# Patient Record
Sex: Male | Born: 1995
Health system: Southern US, Community
[De-identification: ages and names within clinical notes are randomized; demographics above are authoritative.]

## PROBLEM LIST (undated history)

## (undated) DIAGNOSIS — R011 Cardiac murmur, unspecified: Secondary | ICD-10-CM

## (undated) DIAGNOSIS — S83209A Unspecified tear of unspecified meniscus, current injury, unspecified knee, initial encounter: Secondary | ICD-10-CM

## (undated) HISTORY — PX: KNEE SURGERY: SHX244

---
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
## 2015-06-18 ENCOUNTER — Encounter (HOSPITAL_COMMUNITY): Payer: Self-pay | Admitting: *Deleted

## 2015-06-18 ENCOUNTER — Emergency Department (HOSPITAL_COMMUNITY)
Admission: EM | Admit: 2015-06-18 | Discharge: 2015-06-18 | Disposition: A | Discharge status: Home / Self Care | Payer: No Typology Code available for payment source | Attending: Emergency Medicine | Admitting: Emergency Medicine

## 2015-06-18 ENCOUNTER — Emergency Department (HOSPITAL_COMMUNITY)
Admission: EM | Admit: 2015-06-18 | Discharge: 2015-06-18 | Disposition: A | Discharge status: Home / Self Care | Payer: Self-pay | Source: Home / Self Care

## 2015-06-18 DIAGNOSIS — Y998 Other external cause status: Secondary | ICD-10-CM | POA: Insufficient documentation

## 2015-06-18 DIAGNOSIS — Y9389 Activity, other specified: Secondary | ICD-10-CM | POA: Diagnosis not present

## 2015-06-18 DIAGNOSIS — Y9289 Other specified places as the place of occurrence of the external cause: Secondary | ICD-10-CM | POA: Insufficient documentation

## 2015-06-18 DIAGNOSIS — S29012A Strain of muscle and tendon of back wall of thorax, initial encounter: Secondary | ICD-10-CM | POA: Diagnosis not present

## 2015-06-18 DIAGNOSIS — S299XXA Unspecified injury of thorax, initial encounter: Secondary | ICD-10-CM | POA: Diagnosis present

## 2015-06-18 MED ORDER — NAPROXEN 375 MG PO TABS: 375.0000 mg | ORAL_TABLET | Freq: Two times a day (BID) | ORAL | Status: DC

## 2015-06-18 MED ORDER — CYCLOBENZAPRINE HCL 10 MG PO TABS: 5.0000 mg | ORAL_TABLET | Freq: Two times a day (BID) | ORAL | Status: DC | PRN
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
## 2015-06-18 NOTE — ED Notes (Signed)
Declined W/C at D/C and was escorted to lobby by RN. 
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
## 2015-07-30 ENCOUNTER — Emergency Department (HOSPITAL_COMMUNITY)
Admission: EM | Admit: 2015-07-30 | Discharge: 2015-07-30 | Disposition: A | Discharge status: Home / Self Care | Payer: Self-pay | Attending: Emergency Medicine | Admitting: Emergency Medicine

## 2015-07-30 ENCOUNTER — Encounter (HOSPITAL_COMMUNITY): Payer: Self-pay | Admitting: Emergency Medicine

## 2015-07-30 DIAGNOSIS — Z8739 Personal history of other diseases of the musculoskeletal system and connective tissue: Secondary | ICD-10-CM | POA: Insufficient documentation

## 2015-07-30 DIAGNOSIS — Z792 Long term (current) use of antibiotics: Secondary | ICD-10-CM | POA: Insufficient documentation

## 2015-07-30 DIAGNOSIS — Z791 Long term (current) use of non-steroidal anti-inflammatories (NSAID): Secondary | ICD-10-CM | POA: Insufficient documentation

## 2015-07-30 DIAGNOSIS — J039 Acute tonsillitis, unspecified: Secondary | ICD-10-CM | POA: Insufficient documentation

## 2015-07-30 DIAGNOSIS — J069 Acute upper respiratory infection, unspecified: Secondary | ICD-10-CM | POA: Insufficient documentation

## 2015-07-30 LAB — RAPID STREP SCREEN (MED CTR MEBANE ONLY): Streptococcus, Group A Screen (Direct): NEGATIVE

## 2015-07-30 MED ORDER — AZITHROMYCIN 250 MG PO TABS: ORAL_TABLET | ORAL | Status: DC

## 2015-07-30 MED ORDER — AZITHROMYCIN 250 MG PO TABS
500.0000 mg | ORAL_TABLET | Freq: Once | ORAL | Status: AC
  Administered 2015-07-30: 500 mg via ORAL
  Filled 2015-07-30: qty 2

## 2015-07-30 MED ORDER — BENZONATATE 200 MG PO CAPS: 200.0000 mg | ORAL_CAPSULE | Freq: Three times a day (TID) | ORAL | Status: DC | PRN

## 2015-07-30 MED ORDER — GUAIFENESIN 100 MG/5ML PO SOLN
5.0000 mL | Freq: Once | ORAL | Status: AC
  Administered 2015-07-30: 100 mg via ORAL
  Filled 2015-07-30: qty 5

## 2015-07-30 MED ORDER — DEXAMETHASONE 4 MG PO TABS
10.0000 mg | ORAL_TABLET | Freq: Once | ORAL | Status: AC
  Administered 2015-07-30: 10 mg via ORAL
  Filled 2015-07-30: qty 3

## 2015-07-30 NOTE — ED Provider Notes (Signed)
CSN: [id number]     Arrival date & time 07/30/15  0011 History   First MD Initiated Contact with Patient 07/30/15 0017     Chief Complaint  Patient presents with  . Sore Throat     (Consider location/radiation/quality/duration/timing/severity/associated sxs/prior Treatment) Patient is a 19 y.o. male presenting with URI.  URI Presenting symptoms: congestion, cough and sore throat   Severity:  Moderate Onset quality:  Gradual Duration:  8 hours Timing:  Constant Progression:  Worsening Chronicity:  New Relieved by:  None tried Worsened by:  Eating Ineffective treatments:  None tried Associated symptoms: sinus pain    Mordecai RasmussenLorenzo B Skiver is a 19 y.o. male who presents to the ED with a sore throat and congestion. He also reports coughing today until he vomited.  Past Medical History  Diagnosis Date  . Meniscus tear     left knee   Past Surgical History  Procedure Laterality Date  . Menisectomy  06/28/2011    Procedure: MENISECTOMY;  Surgeon: Loreta Aveaniel F Murphy, MD;  [facility];  Service: Orthopedics;;  lateral menisectomy  . Knee surgery     Family History  Problem Relation Age of Onset  . Hypertension Mother   . Diabetes Maternal Grandmother   . Diabetes Paternal Grandmother    Social History  Substance Use Topics  . Smoking status: Passive Smoke Exposure - Never Smoker  . Smokeless tobacco: Never Used     Comment: smokers smoke inside  . Alcohol Use: Yes     Comment: socially    Review of Systems  HENT: Positive for congestion and sore throat.   Respiratory: Positive for cough.   all other systems negative    Allergies  Review of patient's allergies indicates no known allergies.  Home Medications   Prior to Admission medications   Medication Sig Start Date End Date Taking? Authorizing Provider  azithromycin (ZITHROMAX) 250 MG tablet Take one tablet daily 07/30/15   [facility] Orlene OchM Chalice [name], NP  benzonatate (TESSALON) 200 MG capsule Take 1  capsule (200 mg total) by mouth 3 (three) times daily as needed for cough. 07/30/15   Natasja Niday Orlene OchM Shirah Roseman, NP  cyclobenzaprine (FLEXERIL) 10 MG tablet Take 0.5-1 tablets (5-10 mg total) by mouth 2 (two) times daily as needed. 06/18/15   Tiffany Neva SeatGreene, PA-C  naproxen (NAPROSYN) 375 MG tablet Take 1 tablet (375 mg total) by mouth 2 (two) times daily. 06/18/15   Tiffany Neva SeatGreene, PA-C   BP 151/78 mmHg  Pulse 61  Temp(Src) 98.2 F (36.8 C) (Oral)  Resp 16  SpO2 99% Physical Exam  Constitutional: He is oriented to person, place, and time. He appears well-developed and well-nourished. No distress.  HENT:  Head: Normocephalic and atraumatic.  Right Ear: Tympanic membrane normal.  Left Ear: Tympanic membrane normal.  Nose: Mucosal edema present.  Mouth/Throat: Uvula is midline and mucous membranes are normal. Posterior oropharyngeal erythema present. No tonsillar abscesses.  Tonsils enlarged with erythema  Eyes: Conjunctivae and EOM are normal. Pupils are equal, round, and reactive to light.  Neck: Normal range of motion. Neck supple.  Cardiovascular: Normal rate and regular rhythm.   Pulmonary/Chest: Effort normal. No respiratory distress. He has no wheezes. He has no rales.  Abdominal: Soft. There is no tenderness.  Musculoskeletal: Normal range of motion.  Lymphadenopathy:    He has cervical adenopathy (right).  Neurological: He is alert and oriented to person, place, and time. No cranial nerve deficit.  Skin: Skin is warm and dry.  Psychiatric:  He has a normal mood and affect. His behavior is normal.  Nursing note and vitals reviewed.   ED Course  Procedures (including critical care time) Labs Review Results for orders placed or performed during the hospital encounter of 07/30/15 (from the past 24 hour(s))  Rapid strep screen     Status: None   Collection Time: 07/30/15 12:37 AM  Result Value Ref Range   Streptococcus, Group A Screen (Direct) NEGATIVE NEGATIVE    Imaging Review No  results found. I have personally reviewed and evaluated the lab results as part of my medical decision-making.   MDM  19 y.o. male with sore throat and cough that started earlier today. Stable for d/c without tonsillar abscess and no respiratory distress. O2 SAT 99% on R/A. Discussed with the patient clinical and lab findings and plan of care. All questioned fully answered. He will return if any problems arise.   Final diagnoses:  Tonsillitis  URI (upper respiratory infection)       Janne Napoleon, NP 07/30/15 0125  Layla Maw Ward, DO 07/30/15 [phone]

## 2015-07-30 NOTE — ED Notes (Signed)
During scanning of patient meds patient states he took a dose of his mother's Amoxicillin earlier.  Made NP aware.

## 2015-07-30 NOTE — ED Notes (Signed)
Pt sts he woke up this morning to a runny nose, cough, and sore throat.  PT states he has coughed so hard he has vomited today.  NAD in room at this time

## 2015-07-30 NOTE — ED Notes (Signed)
Pt able to ambulate independently 

## 2015-08-01 LAB — CULTURE, GROUP A STREP: STREP A CULTURE: NEGATIVE

## 2015-12-12 ENCOUNTER — Ambulatory Visit: Payer: Worker's Compensation

## 2015-12-12 NOTE — Patient Instructions (Signed)
     IF you received an x-ray today, you will receive an invoice from [facility]. Please contact [facility] at [phone] with questions or concerns regarding your invoice.   IF you received labwork today, you will receive an invoice from [facility]. Please contact [facility] at [phone] with questions or concerns regarding your invoice.   Our billing staff will not be able to assist you with questions regarding bills from these companies.  You will be contacted with the lab results as soon as they are available. The fastest way to get your results is to activate your My Chart account. Instructions are located on the last page of this paperwork. If you have not heard from us regarding the results in 2 weeks, please contact this office.      

## 2015-12-27 ENCOUNTER — Emergency Department (HOSPITAL_COMMUNITY)
Admission: EM | Admit: 2015-12-27 | Discharge: 2015-12-27 | Disposition: A | Discharge status: Home / Self Care | Payer: No Typology Code available for payment source | Attending: Emergency Medicine | Admitting: Emergency Medicine

## 2015-12-27 ENCOUNTER — Encounter (HOSPITAL_COMMUNITY): Payer: Self-pay | Admitting: *Deleted

## 2015-12-27 DIAGNOSIS — R197 Diarrhea, unspecified: Secondary | ICD-10-CM | POA: Insufficient documentation

## 2015-12-27 DIAGNOSIS — R112 Nausea with vomiting, unspecified: Secondary | ICD-10-CM | POA: Insufficient documentation

## 2015-12-27 DIAGNOSIS — R109 Unspecified abdominal pain: Secondary | ICD-10-CM | POA: Insufficient documentation

## 2015-12-27 DIAGNOSIS — Z87828 Personal history of other (healed) physical injury and trauma: Secondary | ICD-10-CM | POA: Insufficient documentation

## 2015-12-27 LAB — COMPREHENSIVE METABOLIC PANEL
ALK PHOS: 57 U/L (ref 38–126)
ALT: 15 U/L — ABNORMAL LOW (ref 17–63)
ANION GAP: 10 (ref 5–15)
AST: 24 U/L (ref 15–41)
Albumin: 4 g/dL (ref 3.5–5.0)
BUN: 7 mg/dL (ref 6–20)
CALCIUM: 9.4 mg/dL (ref 8.9–10.3)
CO2: 26 mmol/L (ref 22–32)
Chloride: 106 mmol/L (ref 101–111)
Creatinine, Ser: 1.09 mg/dL (ref 0.61–1.24)
GFR calc non Af Amer: >60 mL/min (ref >60)
Glucose, Bld: 101 mg/dL — ABNORMAL HIGH (ref 65–99)
POTASSIUM: 3.6 mmol/L (ref 3.5–5.1)
SODIUM: 142 mmol/L (ref 135–145)
Total Bilirubin: 1 mg/dL (ref 0.3–1.2)
Total Protein: 6.9 g/dL (ref 6.5–8.1)

## 2015-12-27 LAB — URINALYSIS, ROUTINE W REFLEX MICROSCOPIC
Bilirubin Urine: NEGATIVE
GLUCOSE, UA: NEGATIVE mg/dL
HGB URINE DIPSTICK: NEGATIVE
Ketones, ur: NEGATIVE mg/dL
LEUKOCYTES UA: NEGATIVE
Nitrite: NEGATIVE
PH: 6.5 (ref 5.0–8.0)
PROTEIN: NEGATIVE mg/dL
SPECIFIC GRAVITY, URINE: 1.025 (ref 1.005–1.030)

## 2015-12-27 LAB — CBC
HCT: 43.5 % (ref 39.0–52.0)
HEMOGLOBIN: 14.5 g/dL (ref 13.0–17.0)
MCH: 29.9 pg (ref 26.0–34.0)
MCHC: 33.3 g/dL (ref 30.0–36.0)
MCV: 89.7 fL (ref 78.0–100.0)
Platelets: 212 10*3/uL (ref 150–400)
RBC: 4.85 MIL/uL (ref 4.22–5.81)
RDW: 12 % (ref 11.5–15.5)
WBC: 3.7 10*3/uL — ABNORMAL LOW (ref 4.0–10.5)

## 2015-12-27 LAB — LIPASE, BLOOD: LIPASE: 22 U/L (ref 11–51)

## 2015-12-27 MED ORDER — ONDANSETRON 4 MG PO TBDP
4.0000 mg | ORAL_TABLET | Freq: Once | ORAL | Status: AC | PRN
  Administered 2015-12-27: 4 mg via ORAL

## 2015-12-27 MED ORDER — SODIUM CHLORIDE 0.9 % IV SOLN
80.0000 mg | Freq: Once | INTRAVENOUS | Status: AC
  Administered 2015-12-27: 80 mg via INTRAVENOUS
  Filled 2015-12-27: qty 80

## 2015-12-27 MED ORDER — ONDANSETRON 4 MG PO TBDP
ORAL_TABLET | ORAL | Status: AC
  Filled 2015-12-27: qty 1

## 2015-12-27 MED ORDER — ONDANSETRON HCL 4 MG/2ML IJ SOLN
4.0000 mg | Freq: Once | INTRAMUSCULAR | Status: AC
  Administered 2015-12-27: 4 mg via INTRAVENOUS
  Filled 2015-12-27: qty 2

## 2015-12-27 MED ORDER — ONDANSETRON 8 MG PO TBDP: 8.0000 mg | ORAL_TABLET | Freq: Three times a day (TID) | ORAL | Status: DC | PRN

## 2015-12-27 MED ORDER — SODIUM CHLORIDE 0.9 % IV BOLUS (SEPSIS)
1000.0000 mL | Freq: Once | INTRAVENOUS | Status: AC
  Administered 2015-12-27: 1000 mL via INTRAVENOUS

## 2015-12-27 MED ORDER — METOCLOPRAMIDE HCL 5 MG/ML IJ SOLN
10.0000 mg | Freq: Once | INTRAMUSCULAR | Status: AC
  Administered 2015-12-27: 10 mg via INTRAVENOUS
  Filled 2015-12-27: qty 2

## 2015-12-27 NOTE — ED Notes (Signed)
Pt in from home via [facility] c/o n/v/d onset today @ 6am, pt reports last meal yesterday at noon, pt reports x 4 vomiting episodes & x 3 diarrhea episodes since onset, pt A&O x4

## 2015-12-27 NOTE — ED Provider Notes (Signed)
CSN: [id number]     Arrival date & time 12/27/15  0840 History   First MD Initiated Contact with Patient 12/27/15 [phone]     Chief Complaint  Patient presents with  . Emesis     (Consider location/radiation/quality/duration/timing/severity/associated sxs/prior Treatment) Patient is a 20 y.o. male presenting with vomiting. The history is provided by the patient.  Emesis Severity:  Moderate Timing:  Constant Quality:  Bilious material Able to tolerate:  Liquids Progression:  Unchanged Chronicity:  New Recent urination:  Normal Context: not post-tussive and not self-induced   Relieved by:  Antiemetics Worsened by:  Nothing tried Ineffective treatments:  None tried Associated symptoms: abdominal pain and diarrhea   Associated symptoms: no arthralgias, no chills, no cough, no fever and no headaches   Associated symptoms comment:  Abdominal pain began after vomiting, no pain preceded Patient drank alcohol last nigh and recently quit daily marijuana Abdominal pain:    Location:  Epigastric   Quality:  Aching   Severity:  Mild   Onset quality:  Gradual   Timing:  Constant   Chronicity:  New Diarrhea:    Quality:  Semi-solid   Number of occurrences:  3   Severity:  Moderate Risk factors: alcohol use   Risk factors: no diabetes, not pregnant now, no prior abdominal surgery, no sick contacts, no suspect food intake and no travel to endemic areas     Past Medical History  Diagnosis Date  . Meniscus tear     left knee   Past Surgical History  Procedure Laterality Date  . Menisectomy  06/28/2011    Procedure: MENISECTOMY;  Surgeon: Loreta Aveaniel F Murphy, MD;  Location: [facility];  Service: Orthopedics;;  lateral menisectomy  . Knee surgery     Family History  Problem Relation Age of Onset  . Hypertension Mother   . Diabetes Maternal Grandmother   . Diabetes Paternal Grandmother    Social History  Substance Use Topics  . Smoking status: Passive Smoke Exposure -  Never Smoker  . Smokeless tobacco: Never Used     Comment: smokers smoke inside  . Alcohol Use: Yes     Comment: socially    Review of Systems  Constitutional: Negative for chills.  Gastrointestinal: Positive for vomiting, abdominal pain and diarrhea.  Musculoskeletal: Negative for arthralgias.  Neurological: Negative for headaches.      Allergies  Review of patient's allergies indicates no known allergies.  Home Medications   Prior to Admission medications   Not on File   There were no vitals taken for this visit. Physical Exam  Constitutional: He is oriented to person, place, and time. He appears well-developed and well-nourished.  HENT:  Head: Normocephalic and atraumatic.  Right Ear: External ear normal.  Left Ear: External ear normal.  Nose: Nose normal.  Mouth/Throat: Oropharynx is clear and moist.  Eyes: Conjunctivae and EOM are normal. Pupils are equal, round, and reactive to light.  Neck: Normal range of motion. Neck supple.  Cardiovascular: Normal rate, regular rhythm, normal heart sounds and intact distal pulses.   Pulmonary/Chest: Effort normal and breath sounds normal. No respiratory distress. He has no wheezes. He exhibits no tenderness.  Abdominal: Soft. Bowel sounds are normal. He exhibits no distension and no mass. There is no tenderness. There is no guarding.  Musculoskeletal: Normal range of motion.  Neurological: He is alert and oriented to person, place, and time. He has normal reflexes. He exhibits normal muscle tone. Coordination normal.  Skin: Skin is warm  and dry.  Psychiatric: He has a normal mood and affect. His behavior is normal. Judgment and thought content normal.  Nursing note and vitals reviewed.   ED Course  Procedures (including critical care time) Labs Review Labs Reviewed  COMPREHENSIVE METABOLIC PANEL - Abnormal; Notable for the following:    Glucose, Bld 101 (*)    ALT 15 (*)    All other components within normal limits  CBC  - Abnormal; Notable for the following:    WBC 3.7 (*)    All other components within normal limits  LIPASE, BLOOD  URINALYSIS, ROUTINE W REFLEX MICROSCOPIC (NOT AT [facility])    Imaging Review No results found. I have personally reviewed and evaluated these images and lab results as part of my medical decision-making.   EKG Interpretation None      MDM   Final diagnoses:  Nausea vomiting and diarrhea      Margarita Grizzle, MD 12/27/15 1151

## 2015-12-27 NOTE — Discharge Instructions (Signed)

## 2016-01-12 ENCOUNTER — Emergency Department (HOSPITAL_COMMUNITY)
Admission: EM | Admit: 2016-01-12 | Discharge: 2016-01-12 | Disposition: A | Discharge status: Home / Self Care | Payer: No Typology Code available for payment source | Attending: Emergency Medicine | Admitting: Emergency Medicine

## 2016-01-12 ENCOUNTER — Encounter (HOSPITAL_COMMUNITY): Payer: Self-pay

## 2016-01-12 DIAGNOSIS — Z7722 Contact with and (suspected) exposure to environmental tobacco smoke (acute) (chronic): Secondary | ICD-10-CM | POA: Insufficient documentation

## 2016-01-12 DIAGNOSIS — H00011 Hordeolum externum right upper eyelid: Secondary | ICD-10-CM | POA: Insufficient documentation

## 2016-01-12 DIAGNOSIS — H00013 Hordeolum externum right eye, unspecified eyelid: Secondary | ICD-10-CM

## 2016-01-12 NOTE — ED Provider Notes (Signed)
CSN: [id number]     Arrival date & time 01/12/16  1509 History   First MD Initiated Contact with Patient 01/12/16 1559     Chief Complaint  Patient presents with  . eyelid swelling      (Consider location/radiation/quality/duration/timing/severity/associated sxs/prior Treatment) HPI   20 year old male presents with c/o R eyelid swelling.  Since yesterday pt has R swollen eyelid.  2 nights ago he report his R eye was itchy.  Has used Visine eye drop without relief, but some relief with warm compress over the eyelid.  Report dull pain around the eye and pain with blinking.  Mild light sensitivity, and vision mildly blurry yesterday but none recent.  Denies fb sensation and denies drainage.  Eyes were not crusted shut.  Does not wear contact lens or glasses.  Denies any other environmental changes.  Did start a new job last week and sts he did a lot of lifting and felt that his dirty hands may have irritated his eye.  No fever, ear pain, sore throats, or URI sxs.  No one else with similar sickness.    Past Medical History  Diagnosis Date  . Meniscus tear     left knee   Past Surgical History  Procedure Laterality Date  . Menisectomy  06/28/2011    Procedure: MENISECTOMY;  Surgeon: Loreta Aveaniel F Murphy, MD;  Location: [facility];  Service: Orthopedics;;  lateral menisectomy  . Knee surgery     Family History  Problem Relation Age of Onset  . Hypertension Mother   . Diabetes Maternal Grandmother   . Diabetes Paternal Grandmother    Social History  Substance Use Topics  . Smoking status: Passive Smoke Exposure - Never Smoker  . Smokeless tobacco: Never Used     Comment: smokers smoke inside  . Alcohol Use: Yes     Comment: socially    Review of Systems  Constitutional: Negative for fever.  Eyes: Positive for pain, redness and itching. Negative for discharge.  Skin: Negative for rash and wound.  Neurological: Negative for headaches.      Allergies  Review of  patient's allergies indicates no known allergies.  Home Medications   Prior to Admission medications   Medication Sig Start Date End Date Taking? Authorizing Provider  ondansetron (ZOFRAN ODT) 8 MG disintegrating tablet Take 1 tablet (8 mg total) by mouth every 8 (eight) hours as needed for nausea or vomiting. 12/27/15   Margarita Grizzleanielle Ray, MD   BP 116/77 mmHg  Pulse 82  Temp(Src) 98.4 F (36.9 C) (Oral)  Resp 18  SpO2 96% Physical Exam  Constitutional: He appears well-developed and well-nourished. No distress.  HENT:  Head: Atraumatic.  Eyes: Conjunctivae and EOM are normal. Pupils are equal, round, and reactive to light. Right eye exhibits hordeolum. Right eye exhibits no chemosis, no discharge and no exudate. No foreign body present in the right eye. Left eye exhibits no discharge. No scleral icterus.  R eye: upper eyelid is edematous and mildly tender to palpation.  No fb noted, no discharge.  EOMI, PERRL.    Neck: Neck supple.  Neurological: He is alert.  Skin: No rash noted.  Psychiatric: He has a normal mood and affect.  Nursing note and vitals reviewed.   ED Course  Procedures (including critical care time)   MDM   Final diagnoses:  Hordeolum external, right    BP 116/77 mmHg  Pulse 82  Temp(Src) 98.4 F (36.9 C) (Oral)  Resp 18  SpO2  96%    4:35 PM Pt here with R eyelid swelling that is painful.  This is consistent with a hordeolum and less likely a chalazion.  No eye involvement.  Doubt conjunctivitis.  Recommend warm compress.  Eye specialist referral given as needed.  Return precaution discussed.    Fayrene Helper, PA-C 01/12/16 1637  Derwood Kaplan, MD 01/13/16 0730

## 2016-01-12 NOTE — ED Notes (Signed)
Patient here with right eyelid swelling x 2 days. Reports that the lid is throbbing and some blurred vision with same

## 2016-01-12 NOTE — ED Notes (Signed)
PA at bedside.

## 2016-01-12 NOTE — Discharge Instructions (Signed)
Continue to apply warm compress regularly as treatment of your stye.  Stye A stye is a bump on your eyelid caused by a bacterial infection. A stye can form inside the eyelid (internal stye) or outside the eyelid (external stye). An internal stye may be caused by an infected oil-producing gland inside your eyelid. An external stye may be caused by an infection at the base of your eyelash (hair follicle). Styes are very common. Anyone can get them at any age. They usually occur in just one eye, but you may have more than one in either eye.  CAUSES  The infection is almost always caused by bacteria called Staphylococcus aureus. This is a common type of bacteria that lives on your skin. RISK FACTORS You may be at higher risk for a stye if you have had one before. You may also be at higher risk if you have:  Diabetes.  Long-term illness.  Long-term eye redness.  A skin condition called seborrhea.  High fat levels in your blood (lipids). SIGNS AND SYMPTOMS  Eyelid pain is the most common symptom of a stye. Internal styes are more painful than external styes. Other signs and symptoms may include:  Painful swelling of your eyelid.  A scratchy feeling in your eye.  Tearing and redness of your eye.  Pus draining from the stye. DIAGNOSIS  Your health care provider may be able to diagnose a stye just by examining your eye. The health care provider may also check to make sure:  You do not have a fever or other signs of a more serious infection.  The infection has not spread to other parts of your eye or areas around your eye. TREATMENT  Most styes will clear up in a few days without treatment. In some cases, you may need to use antibiotic drops or ointment to prevent infection. Your health care provider may have to drain the stye surgically if your stye is:  Large.  Causing a lot of pain.  Interfering with your vision. This can be done using a thin blade or a needle.  HOME CARE  INSTRUCTIONS   Take medicines only as directed by your health care provider.  Apply a clean, warm compress to your eye for 10 minutes, 4 times a day.  Do not wear contact lenses or eye makeup until your stye has healed.  Do not try to pop or drain the stye. SEEK MEDICAL CARE IF:  You have chills or a fever.  Your stye does not go away after several days.  Your stye affects your vision.  Your eyeball becomes swollen, red, or painful. MAKE SURE YOU:  Understand these instructions.  Will watch your condition.  Will get help right away if you are not doing well or get worse.   This information is not intended to replace advice given to you by your health care provider. Make sure you discuss any questions you have with your health care provider.   Document Released: 05/09/2005 Document Revised: 08/20/2014 Document Reviewed: 11/13/2013 Elsevier Interactive Patient Education [employer]2016 Elsevier Inc.

## 2016-01-12 NOTE — ED Notes (Signed)
Patient able to ambulate independently  

## 2016-04-04 ENCOUNTER — Encounter (HOSPITAL_COMMUNITY): Payer: Self-pay | Admitting: Emergency Medicine

## 2016-04-04 ENCOUNTER — Emergency Department (HOSPITAL_COMMUNITY): Payer: Self-pay

## 2016-04-04 ENCOUNTER — Emergency Department (HOSPITAL_COMMUNITY)
Admission: EM | Admit: 2016-04-04 | Discharge: 2016-04-04 | Disposition: A | Discharge status: Home / Self Care | Payer: Self-pay | Attending: Emergency Medicine | Admitting: Emergency Medicine

## 2016-04-04 DIAGNOSIS — S61012A Laceration without foreign body of left thumb without damage to nail, initial encounter: Secondary | ICD-10-CM | POA: Insufficient documentation

## 2016-04-04 DIAGNOSIS — Y999 Unspecified external cause status: Secondary | ICD-10-CM | POA: Insufficient documentation

## 2016-04-04 DIAGNOSIS — Z79899 Other long term (current) drug therapy: Secondary | ICD-10-CM | POA: Insufficient documentation

## 2016-04-04 DIAGNOSIS — Z23 Encounter for immunization: Secondary | ICD-10-CM | POA: Insufficient documentation

## 2016-04-04 DIAGNOSIS — Y929 Unspecified place or not applicable: Secondary | ICD-10-CM | POA: Insufficient documentation

## 2016-04-04 DIAGNOSIS — W25XXXA Contact with sharp glass, initial encounter: Secondary | ICD-10-CM | POA: Insufficient documentation

## 2016-04-04 DIAGNOSIS — Z7722 Contact with and (suspected) exposure to environmental tobacco smoke (acute) (chronic): Secondary | ICD-10-CM | POA: Insufficient documentation

## 2016-04-04 DIAGNOSIS — Y9389 Activity, other specified: Secondary | ICD-10-CM | POA: Insufficient documentation

## 2016-04-04 MED ORDER — TETANUS-DIPHTH-ACELL PERTUSSIS 5-2.5-18.5 LF-MCG/0.5 IM SUSP
0.5000 mL | Freq: Once | INTRAMUSCULAR | Status: AC
  Administered 2016-04-04: 0.5 mL via INTRAMUSCULAR
  Filled 2016-04-04: qty 0.5

## 2016-04-04 MED ORDER — LIDOCAINE-EPINEPHRINE 2 %-1:100000 IJ SOLN
10.0000 mL | Freq: Once | INTRAMUSCULAR | Status: AC
  Administered 2016-04-04: 10 mL
  Filled 2016-04-04: qty 1

## 2016-04-04 NOTE — Discharge Instructions (Signed)
Keep wound clean with mild soap and water. Keep area covered with a topical antibiotic ointment and bandage, keep bandage dry, and do not submerge in water for 24 hours. Wear splint on thumb to protect the sutures while the wound is healing. Ice and elevate for additional pain relief and swelling. Alternate between Ibuprofen and Tylenol for additional pain relief. Follow up with your primary care doctor or the [facility] in approximately 7-10 days for wound recheck and suture removal. Monitor area for signs of infection to include, but not limited to: increasing pain, spreading redness, drainage/pus, worsening swelling, or fevers. Return to emergency department for emergent changing or worsening symptoms.

## 2016-04-04 NOTE — ED Notes (Signed)
Patient was alert, oriented and stable upon discharge. RN went over AVS and patient had no further questions.  

## 2016-04-04 NOTE — ED Provider Notes (Signed)
[facility]-EMERGENCY DEPT Provider Note   CSN: [id number] Arrival date & time: 04/04/16  1214  By signing my name below, I, Modena Jansky, attest that this documentation has been prepared under the direction and in the presence of non-physician practitioner, [address], PA-C. Electronically Signed: Modena Jansky, Scribe. 04/04/2016. 2:35 PM.   History   Chief Complaint Chief Complaint  Patient presents with  . Laceration   The history is provided by the patient and medical records. No language interpreter was used.  Laceration   The incident occurred 3 to 5 hours ago. The laceration is located on the left hand (left thumb). The laceration is 1 cm in size. The laceration mechanism was a a metal edge. The pain is at a severity of 9/10. The pain is moderate. The pain has been constant since onset. He reports no foreign bodies present. His tetanus status is unknown.   HPI Comments: Steve Wilson is a 20 y.o. male who presents to the Emergency Department complaining of a left thumb wound that occurred about 3 hours prior to exam (at around 11:30am). He reports that he cut his left thumb on the metal car window frame while changing the regulator, and now has a bleeding wound in the affected area. He states that he has some mild pain at the site of the laceration, which he describes as 9/10 constant stinging non-radiating left thumb pain, exacerbated by movement, and he had no treatment tried PTA. His tetanus status is unknown. He denies any other injuries or hx of bleeding disorders. Bleeding somewhat controlled with pressure. He denies any numbness/tingling, or weakness.    Past Medical History:  Diagnosis Date  . Meniscus tear    left knee    There are no active problems to display for this patient.   Past Surgical History:  Procedure Laterality Date  . KNEE SURGERY    . MENISECTOMY  06/28/2011   Procedure: MENISECTOMY;  Surgeon: Loreta Ave, MD;  Location: [facility];  Service: Orthopedics;;  lateral menisectomy       Home Medications    Prior to Admission medications   Medication Sig Start Date End Date Taking? Authorizing Provider  ondansetron (ZOFRAN ODT) 8 MG disintegrating tablet Take 1 tablet (8 mg total) by mouth every 8 (eight) hours as needed for nausea or vomiting. 12/27/15   Margarita Grizzle, MD    Family History Family History  Problem Relation Age of Onset  . Hypertension Mother   . Diabetes Maternal Grandmother   . Diabetes Paternal Grandmother     Social History Social History  Substance Use Topics  . Smoking status: Passive Smoke Exposure - Never Smoker  . Smokeless tobacco: Never Used     Comment: smokers smoke inside  . Alcohol use Yes     Comment: socially     Allergies   Review of patient's allergies indicates no known allergies.   Review of Systems Review of Systems  Musculoskeletal: Positive for arthralgias (L thumb).  Skin: Positive for wound (Left thumb).  Allergic/Immunologic: Negative for immunocompromised state.  Neurological: Negative for weakness and numbness.  Hematological: Does not bruise/bleed easily.     Physical Exam Updated Vital Signs BP 127/78   Pulse (!) 53   Temp 98.7 F (37.1 C)   Resp 15   SpO2 99%   Physical Exam  Constitutional: He is oriented to person, place, and time. Vital signs are normal. He appears well-developed and well-nourished.  Non-toxic appearance. No distress.  Afebrile, nontoxic, NAD  HENT:  Head: Normocephalic and atraumatic.  Mouth/Throat: Mucous membranes are normal.  Eyes: Conjunctivae and EOM are normal. Right eye exhibits no discharge. Left eye exhibits no discharge.  Neck: Normal range of motion. Neck supple.  Cardiovascular: Normal rate and intact distal pulses.   Pulmonary/Chest: Effort normal. No respiratory distress.  Abdominal: Normal appearance. He exhibits no distension.  Musculoskeletal:       Left hand: He exhibits decreased  range of motion (Due to pain), tenderness, bony tenderness and laceration. He exhibits normal two-point discrimination, normal capillary refill, no deformity and no swelling. Normal sensation noted. Normal strength noted.  Left thumb with minimally limited ROM due to pain, although still able to flex and extend at [facility] and PIP joints, with small linear laceration ~1 cm in length to the lateral aspect of left thumb with some minimal oozing bleeding, which is controlled with pressure. Mild TTP over the laceration, no crepitus or deformity, no swelling, with sensation and strength grossly intact, cap refill brisk and present, distal pulses intact. SEE PICTURE BELOW  Neurological: He is alert and oriented to person, place, and time. He has normal strength. No sensory deficit.  Skin: Skin is warm and dry. Laceration noted. No rash noted.  Left thumb lac as noted above and pictured below.   Psychiatric: He has a normal mood and affect.  Nursing note and vitals reviewed.      ED Treatments / Results  DIAGNOSTIC STUDIES: Oxygen Saturation is 99% on RA, normal by my interpretation.    COORDINATION OF CARE: 2:28 PM- Pt advised of plan for treatment and pt agrees.  Labs (all labs ordered are listed, but only abnormal results are displayed) Labs Reviewed - No data to display  EKG  EKG Interpretation None       Radiology Dg Finger Thumb Left  Result Date: 04/04/2016 CLINICAL DATA:  Left thumb laceration today. Evaluate for foreign body or fracture. Initial encounter. EXAM: LEFT THUMB 2+V COMPARISON:  05/05/2008. FINDINGS: The mineralization and alignment are normal. There is no evidence of acute fracture or dislocation. The joint spaces are maintained. No foreign bodies are identified. There is mild soft tissue irregularity around the interphalangeal joint at the site of the patient's laceration. Overlying bandage noted. IMPRESSION: No acute osseous findings or evidence of foreign body.  Electronically Signed   By: Carey BullocksWilliam  Veazey M.D.   On: 04/04/2016 15:10    Procedures .Marland Kitchen.Laceration Repair Date/Time: 04/04/2016 3:30 PM Performed by: Allen DerryAMPRUBI-SOMS, Marino Rogerson Authorized by: Allen DerryAMPRUBI-SOMS, Darian Cansler   Consent:    Consent obtained:  Verbal   Consent given by:  Patient   Risks discussed:  Pain and infection   Alternatives discussed:  No treatment and delayed treatment Anesthesia (see MAR for exact dosages):    Anesthesia method:  Local infiltration   Local anesthetic:  Lidocaine 2% WITH epi Laceration details:    Location:  Finger   Finger location:  L thumb   Length (cm):  1.5   Depth (mm):  2 Repair type:    Repair type:  Simple Pre-procedure details:    Preparation:  Patient was prepped and draped in usual sterile fashion and imaging obtained to evaluate for foreign bodies Exploration:    Hemostasis achieved with:  Tourniquet   Wound exploration: wound explored through full range of motion and entire depth of wound probed and visualized     Wound extent: no foreign bodies/material noted, no nerve damage noted, no tendon damage noted, no underlying fracture noted  and no vascular damage noted     Contaminated: no   Treatment:    Area cleansed with:  Saline   Amount of cleaning:  Extensive   Irrigation solution:  Sterile saline   Irrigation method:  Syringe   Visualized foreign bodies/material removed: no   Skin repair:    Repair method:  Sutures   Suture size:  5-0   Suture material:  Prolene   Suture technique:  Simple interrupted   Number of sutures:  3 Approximation:    Approximation:  Close   Vermilion border: well-aligned   Post-procedure details:    Dressing:  Splint for protection, antibiotic ointment and non-adherent dressing   Patient tolerance of procedure:  Tolerated well, no immediate complications   (including critical care time)  Medications Ordered in ED Medications  lidocaine-EPINEPHrine (XYLOCAINE W/EPI) 2 %-1:100000 (with pres)  injection 10 mL (not administered)  Tdap (BOOSTRIX) injection 0.5 mL (0.5 mLs Intramuscular Given 04/04/16 1454)     Initial Impression / Assessment and Plan / ED Course  I have reviewed the triage vital signs and the nursing notes.  Pertinent labs & imaging results that were available during my care of the patient were reviewed by me and considered in my medical decision making (see chart for details).  Clinical Course    20 y.o. male here with linear lac to L thumb, oozing blood but well controlled with pressure, no obvious FBs noted but difficult to assess due to oozing blood. NVI with soft compartments and ROM preserved in digit. Will obtain xray to ensure no underlying bone injury or FBs. Will update tetanus. Will proceed with suture repair after xray.   3:49 PM  Xray neg. Wound repaired with 3 sutures, good hemostasis. Will apply splint for protection while it's healing. Wound care discussed. Doubt need for ppx abx. F/up with PCP or [facility] in 7-10 days for suture removal. RICE and tylenol/motrin for pain. I explained the diagnosis and have given explicit precautions to return to the ER including for any other new or worsening symptoms. The patient understands and accepts the medical plan as it's been dictated and I have answered their questions. Discharge instructions concerning home care and prescriptions have been given. The patient is STABLE and is discharged to home in good condition.    Final Clinical Impressions(s) / ED Diagnoses   Final diagnoses:  Thumb laceration, left, initial encounter    New Prescriptions New Prescriptions   No medications on file   I personally performed the services described in this documentation, which was scribed in my presence. The recorded information has been reviewed and is accurate.     Sheliah Fiorillo Camprubi-Soms, PA-C 04/04/16 1551    Arby BarretteMarcy Pfeiffer, MD 04/07/16 1433

## 2016-04-04 NOTE — ED Triage Notes (Signed)
Patient here from home with complaints of laceration of left thumb. Bleeding. Reports cutting thumb on glass in car.

## 2017-07-17 ENCOUNTER — Encounter (HOSPITAL_COMMUNITY): Payer: Self-pay

## 2017-07-17 ENCOUNTER — Emergency Department (HOSPITAL_COMMUNITY)
Admission: EM | Admit: 2017-07-17 | Discharge: 2017-07-17 | Disposition: A | Discharge status: Home / Self Care | Payer: No Typology Code available for payment source | Attending: Emergency Medicine | Admitting: Emergency Medicine

## 2017-07-17 ENCOUNTER — Emergency Department (HOSPITAL_COMMUNITY): Payer: No Typology Code available for payment source

## 2017-07-17 ENCOUNTER — Other Ambulatory Visit: Payer: Self-pay

## 2017-07-17 DIAGNOSIS — S3992XA Unspecified injury of lower back, initial encounter: Secondary | ICD-10-CM | POA: Diagnosis present

## 2017-07-17 DIAGNOSIS — Z7722 Contact with and (suspected) exposure to environmental tobacco smoke (acute) (chronic): Secondary | ICD-10-CM | POA: Diagnosis not present

## 2017-07-17 DIAGNOSIS — Y999 Unspecified external cause status: Secondary | ICD-10-CM | POA: Diagnosis not present

## 2017-07-17 DIAGNOSIS — Y9389 Activity, other specified: Secondary | ICD-10-CM | POA: Insufficient documentation

## 2017-07-17 DIAGNOSIS — S39012A Strain of muscle, fascia and tendon of lower back, initial encounter: Secondary | ICD-10-CM | POA: Diagnosis not present

## 2017-07-17 DIAGNOSIS — Y92410 Unspecified street and highway as the place of occurrence of the external cause: Secondary | ICD-10-CM | POA: Insufficient documentation

## 2017-07-17 MED ORDER — IBUPROFEN 600 MG PO TABS: 600.0000 mg | ORAL_TABLET | Freq: Four times a day (QID) | ORAL | 0 refills | Status: DC | PRN
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
## 2017-08-01 ENCOUNTER — Other Ambulatory Visit: Payer: Self-pay

## 2017-08-01 ENCOUNTER — Encounter (HOSPITAL_COMMUNITY): Payer: Self-pay

## 2017-08-01 ENCOUNTER — Emergency Department (HOSPITAL_COMMUNITY)
Admission: EM | Admit: 2017-08-01 | Discharge: 2017-08-01 | Disposition: A | Discharge status: Home / Self Care | Payer: No Typology Code available for payment source | Attending: Emergency Medicine | Admitting: Emergency Medicine

## 2017-08-01 DIAGNOSIS — Z7722 Contact with and (suspected) exposure to environmental tobacco smoke (acute) (chronic): Secondary | ICD-10-CM | POA: Insufficient documentation

## 2017-08-01 DIAGNOSIS — M25562 Pain in left knee: Secondary | ICD-10-CM | POA: Diagnosis not present

## 2017-08-01 DIAGNOSIS — M25561 Pain in right knee: Secondary | ICD-10-CM

## 2017-08-01 DIAGNOSIS — R51 Headache: Secondary | ICD-10-CM | POA: Insufficient documentation

## 2017-08-01 DIAGNOSIS — R519 Headache, unspecified: Secondary | ICD-10-CM

## 2017-08-01 MED ORDER — NAPROXEN 375 MG PO TABS: 375.0000 mg | ORAL_TABLET | Freq: Two times a day (BID) | ORAL | 0 refills | Status: DC

## 2017-08-01 MED ORDER — ACETAMINOPHEN 325 MG PO TABS
650.0000 mg | ORAL_TABLET | Freq: Once | ORAL | Status: AC
  Administered 2017-08-01: 650 mg via ORAL
  Filled 2017-08-01: qty 2
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
## 2017-08-11 ENCOUNTER — Emergency Department (HOSPITAL_COMMUNITY)
Admission: EM | Admit: 2017-08-11 | Discharge: 2017-08-11 | Disposition: A | Discharge status: Home / Self Care | Payer: Self-pay | Attending: Emergency Medicine | Admitting: Emergency Medicine

## 2017-08-11 ENCOUNTER — Encounter (HOSPITAL_COMMUNITY): Payer: Self-pay | Admitting: Emergency Medicine

## 2017-08-11 DIAGNOSIS — H60392 Other infective otitis externa, left ear: Secondary | ICD-10-CM

## 2017-08-11 DIAGNOSIS — L089 Local infection of the skin and subcutaneous tissue, unspecified: Secondary | ICD-10-CM | POA: Insufficient documentation

## 2017-08-11 MED ORDER — SULFAMETHOXAZOLE-TRIMETHOPRIM 800-160 MG PO TABS: 1.0000 | ORAL_TABLET | Freq: Two times a day (BID) | ORAL | 0 refills | Status: AC

## 2017-08-11 MED ORDER — CEPHALEXIN 500 MG PO CAPS: 500.0000 mg | ORAL_CAPSULE | Freq: Four times a day (QID) | ORAL | 0 refills | Status: DC

## 2017-08-11 NOTE — ED Provider Notes (Signed)
[facility]-EMERGENCY DEPT Provider Note   CSN: [id number] Arrival date & time: 08/11/17  1701     History   Chief Complaint Chief Complaint  Patient presents with  . Otalgia    HPI Steve Wilson is a 21 y.o. male who presents to the ED with ear pain. The pain is located in the left ear lobe and started 3 days ago. Patient reports wearing an ear ring in the ear before it started to swell and be painful. Patient denies fever or other problems.   HPI  Past Medical History:  Diagnosis Date  . Meniscus tear    left knee    There are no active problems to display for this patient.   Past Surgical History:  Procedure Laterality Date  . KNEE SURGERY    . MENISECTOMY  06/28/2011   Procedure: MENISECTOMY;  Surgeon: Loreta Aveaniel F Murphy, MD;  Location: [facility];  Service: Orthopedics;;  lateral menisectomy       Home Medications    Prior to Admission medications   Medication Sig Start Date End Date Taking? Authorizing Provider  cephALEXin (KEFLEX) 500 MG capsule Take 1 capsule (500 mg total) by mouth 4 (four) times daily. 08/11/17   Janne NapoleonNeese, Sheronda Parran M, NP  ondansetron (ZOFRAN ODT) 8 MG disintegrating tablet Take 1 tablet (8 mg total) by mouth every 8 (eight) hours as needed for nausea or vomiting. Patient not taking: Reported on 08/01/2017 12/27/15   Margarita Grizzleay, Danielle, MD  sulfamethoxazole-trimethoprim (BACTRIM DS,SEPTRA DS) 800-160 MG tablet Take 1 tablet by mouth 2 (two) times daily for 7 days. 08/11/17 08/18/17  Janne NapoleonNeese, Breyana Follansbee M, NP    Family History Family History  Problem Relation Age of Onset  . Hypertension Mother   . Diabetes Maternal Grandmother   . Diabetes Paternal Grandmother     Social History Social History   Tobacco Use  . Smoking status: Passive Smoke Exposure - Never Smoker  . Smokeless tobacco: Never Used  . Tobacco comment: smokers smoke inside  Substance Use Topics  . Alcohol use: Yes    Comment: socially  . Drug  use: No     Allergies   Patient has no known allergies.   Review of Systems Review of Systems  Constitutional: Negative for fever.  HENT: Positive for ear pain (left ear lobe).   Skin: Positive for wound.  All other systems reviewed and are negative.    Physical Exam Updated Vital Signs BP 118/66 (BP Location: Right Arm)   Pulse 83   Temp 98.1 F (36.7 C) (Oral)   Resp 18   SpO2 100%   Physical Exam  Constitutional: He is oriented to person, place, and time. He appears well-developed and well-nourished. No distress.  HENT:  Head: Normocephalic.  Left Ear: There is swelling and tenderness.  Ears:  Eyes: EOM are normal.  Neck: Neck supple.  Cardiovascular: Normal rate.  Pulmonary/Chest: Effort normal.  Abdominal: Soft. There is no tenderness.  Musculoskeletal: Normal range of motion.  Neurological: He is alert and oriented to person, place, and time. No cranial nerve deficit.  Skin: Skin is warm and dry.  Psychiatric: He has a normal mood and affect.  Nursing note and vitals reviewed.    ED Treatments / Results  Labs (all labs ordered are listed, but only abnormal results are displayed) Labs Reviewed - No data to display  Radiology No results found.  Procedures Procedures (including critical care time)  Medications Ordered in ED Medications - No  data to display   Initial Impression / Assessment and Plan / ED Course  I have reviewed the triage vital signs and the nursing notes. 21 y.o. male with swelling and drainage from the left ear lobe stable for d/c without facial swelling, fever or red streaking. Will treat for infection and patient will apply warm wet compresses several times a day and take tylenol and ibuprofen as needed for pain. He will return for worsening symptoms.   Final Clinical Impressions(s) / ED Diagnoses   Final diagnoses:  Infected skin of earlobe, left    ED Discharge Orders        Ordered    cephALEXin (KEFLEX) 500 MG capsule   4 times daily     08/11/17 1744    sulfamethoxazole-trimethoprim (BACTRIM DS,SEPTRA DS) 800-160 MG tablet  2 times daily     08/11/17 1744       Kerrie Buffaloeese, Janelie Goltz HillsboroM, NP 08/11/17 2317    Rolan BuccoBelfi, Melanie, MD 08/11/17 [phone]

## 2017-08-11 NOTE — ED Triage Notes (Signed)
Patient c/o left ear lobe pain x3 days. Ear lobe red swollen. Speaking in full sentences without difficulty.

## 2017-08-11 NOTE — Discharge Instructions (Signed)
The infected area on your earlobe is already draining so we do not need to open and drain it more. Take the antibiotics as directed. Take tylenol and ibuprofen as needed for pain. Apply warm wet compresses to the area several times a day.

## 2017-12-11 ENCOUNTER — Emergency Department (HOSPITAL_COMMUNITY)
Admission: EM | Admit: 2017-12-11 | Discharge: 2017-12-11 | Disposition: A | Discharge status: Home / Self Care | Payer: Self-pay | Attending: Emergency Medicine | Admitting: Emergency Medicine

## 2017-12-11 ENCOUNTER — Other Ambulatory Visit: Payer: Self-pay

## 2017-12-11 ENCOUNTER — Encounter (HOSPITAL_COMMUNITY): Payer: Self-pay

## 2017-12-11 DIAGNOSIS — K0889 Other specified disorders of teeth and supporting structures: Secondary | ICD-10-CM | POA: Insufficient documentation

## 2017-12-11 DIAGNOSIS — Z7722 Contact with and (suspected) exposure to environmental tobacco smoke (acute) (chronic): Secondary | ICD-10-CM | POA: Insufficient documentation

## 2017-12-11 DIAGNOSIS — Y92828 Other wilderness area as the place of occurrence of the external cause: Secondary | ICD-10-CM | POA: Insufficient documentation

## 2017-12-11 DIAGNOSIS — W57XXXA Bitten or stung by nonvenomous insect and other nonvenomous arthropods, initial encounter: Secondary | ICD-10-CM | POA: Insufficient documentation

## 2017-12-11 DIAGNOSIS — Y999 Unspecified external cause status: Secondary | ICD-10-CM | POA: Insufficient documentation

## 2017-12-11 DIAGNOSIS — Y9359 Activity, other involving other sports and athletics played individually: Secondary | ICD-10-CM | POA: Insufficient documentation

## 2017-12-11 DIAGNOSIS — S30865A Insect bite (nonvenomous) of unspecified external genital organs, male, initial encounter: Secondary | ICD-10-CM | POA: Insufficient documentation

## 2017-12-11 MED ORDER — PENICILLIN V POTASSIUM 500 MG PO TABS: 500.0000 mg | ORAL_TABLET | Freq: Four times a day (QID) | ORAL | 0 refills | Status: AC

## 2017-12-11 MED ORDER — ACETAMINOPHEN 325 MG PO TABS: 650.0000 mg | ORAL_TABLET | Freq: Four times a day (QID) | ORAL | 0 refills | Status: AC | PRN

## 2017-12-11 NOTE — ED Notes (Signed)
Pt c/o dental pain X 2 days, also reports he pulled a tick off of his groin today. Denies swelling, warmth, or redness. Pt has tick in bag, appears to have head on it.

## 2017-12-11 NOTE — ED Provider Notes (Signed)
[facility] EMERGENCY DEPARTMENT Provider Note   CSN: [id number] Arrival date & time: 12/11/17  4098     History   Chief Complaint Chief Complaint  Patient presents with  . Dental Pain  . Tick Removal    HPI Steve Wilson is a 22 y.o. male.  HPI   Patient is a 22 year old male presents the ED today with multiple complaints.  Patient states that he found a tick to his genital area this morning and removed it by "burning it off".  Denies any fevers, chills, body aches, or other systemic symptoms.  Denies noting any ticks anywhere else.  States that he went fishing this weekend and may have gotten the tick on his body during that time.  Denies any surrounding rashes.  Patient brought the tick with him in a bag appears to be consistent with tick and not another insect.  Patient also complains of right upper dental pain that has been ongoing for the last 2 days.  Reports a fractured tooth to the right upper molar area.  Denies fevers, facial swelling, neck swelling, difficulty swallowing, difficulty opening mouth, headaches, vision changes or any other symptoms.  He does not have a dentist.  He has tried taking Tylenol which has improved his pain.  Pain is constant and rated as severe.  Past Medical History:  Diagnosis Date  . Meniscus tear    left knee    There are no active problems to display for this patient.   Past Surgical History:  Procedure Laterality Date  . KNEE SURGERY    . MENISECTOMY  06/28/2011   Procedure: MENISECTOMY;  Surgeon: Loreta Ave, MD;  Location: [facility];  Service: Orthopedics;;  lateral menisectomy        Home Medications    Prior to Admission medications   Medication Sig Start Date End Date Taking? Authorizing Provider  acetaminophen (TYLENOL) 325 MG tablet Take 2 tablets (650 mg total) by mouth every 6 (six) hours as needed. Do not take more than  of tylenol per day 12/11/17   Irais Mottram S,  PA-C  cephALEXin (KEFLEX) 500 MG capsule Take 1 capsule (500 mg total) by mouth 4 (four) times daily. 08/11/17   Janne Napoleon, NP  ondansetron (ZOFRAN ODT) 8 MG disintegrating tablet Take 1 tablet (8 mg total) by mouth every 8 (eight) hours as needed for nausea or vomiting. Patient not taking: Reported on 08/01/2017 12/27/15   Margarita Grizzle, MD  penicillin v potassium (VEETID) 500 MG tablet Take 1 tablet (500 mg total) by mouth 4 (four) times daily for 7 days. 12/11/17 12/18/17  Tiger Spieker S, PA-C    Family History Family History  Problem Relation Age of Onset  . Hypertension Mother   . Diabetes Maternal Grandmother   . Diabetes Paternal Grandmother     Social History Social History   Tobacco Use  . Smoking status: Passive Smoke Exposure - Never Smoker  . Smokeless tobacco: Never Used  . Tobacco comment: smokers smoke inside  Substance Use Topics  . Alcohol use: Yes    Comment: socially  . Drug use: No     Allergies   Patient has no known allergies.   Review of Systems Review of Systems  Constitutional: Negative for chills, fatigue and fever.  HENT: Positive for dental problem. Negative for sore throat and trouble swallowing.   Eyes: Negative for visual disturbance.  Respiratory: Negative for shortness of breath.   Cardiovascular: Negative for  chest pain.  Gastrointestinal: Negative for abdominal pain and diarrhea.  Genitourinary: Negative for frequency.  Musculoskeletal: Negative for back pain, myalgias, neck pain and neck stiffness.  Skin: Negative for color change, rash and wound.  Neurological: Negative for dizziness, weakness, light-headedness, numbness and headaches.     Physical Exam Updated Vital Signs BP 124/89 (BP Location: Right Arm)   Pulse 67   Temp 98.9 F (37.2 C) (Oral)   Resp 18   Ht  (2.007 m)   Wt 102.1 kg (225 lb)   SpO2 99%   BMI 25.35 kg/m   Physical Exam  Constitutional: He appears well-developed and well-nourished.  HENT:    Head: Normocephalic and atraumatic.  Mouth/Throat: Oropharynx is clear and moist.  Multiple dental caries and fractured teeth.  No obvious gross dental abscess on exam.  Patient has tenderness to percussion of tooth #3 and 4.  No facial swelling or neck swelling.  No swelling beneath the tongue.  No trismus.  Patent airway no difficulty swallowing.  Eyes: Pupils are equal, round, and reactive to light. Conjunctivae and EOM are normal.  Neck: Normal range of motion. Neck supple.  Cardiovascular: Normal rate and regular rhythm.  No murmur heard. Pulmonary/Chest: Effort normal and breath sounds normal. No respiratory distress.  Abdominal: Soft. Bowel sounds are normal. There is no tenderness.  Genitourinary:  Genitourinary Comments: gu exam performed with chaperone present. Scrotal area where tick was attached was examined and there does not appears to be any remnants of tick on skin. No surrounding erythema or rashes. No ttp.   Musculoskeletal: He exhibits no edema.  Lymphadenopathy:    He has no cervical adenopathy.  Neurological: He is alert.  Mental Status:  Alert, thought content appropriate, able to give a coherent history. Speech fluent without evidence of aphasia. Able to follow 2 step commands without difficulty.  Cranial Nerves:  II: pupils equal, round, reactive to light III,IV, VI: ptosis not present, extra-ocular motions intact bilaterally  V,VII: smile symmetric, facial light touch sensation equal VIII: hearing grossly normal to voice  X: uvula elevates symmetrically  XI: bilateral shoulder shrug symmetric and strong XII: midline tongue extension without fassiculations  Skin: Skin is warm and dry.  Psychiatric: He has a normal mood and affect.  Nursing note and vitals reviewed.    ED Treatments / Results  Labs (all labs ordered are listed, but only abnormal results are displayed) Labs Reviewed - No data to display  EKG None  Radiology No results  found.  Procedures Procedures (including critical care time)  Medications Ordered in ED Medications - No data to display   Initial Impression / Assessment and Plan / ED Course  I have reviewed the triage vital signs and the nursing notes.  Pertinent labs & imaging results that were available during my care of the patient were reviewed by me and considered in my medical decision making (see chart for details).     Final Clinical Impressions(s) / ED Diagnoses   Final diagnoses:  Pain, dental  Tick bite, initial encounter   Patient with toothache.  No gross abscess.  Exam unconcerning for Ludwig's angina or spread of infection.  Will treat with penicillin and pain medicine.  Urged patient to follow-up with dentist.  Resources given.  Return precautions discussed.  He also presented for evaluation after he removed a tick to his genital area.  Genital area shows no signs of remnants of tick on body.  Advised him to monitor for concerning symptoms  and to return immediately if he experiences these.  Advised PCP follow-up in 1 week.  He voiced understanding of return precautions and agrees to follow-up as directed.  All questions answered.   ED Discharge Orders        Ordered    penicillin v potassium (VEETID) 500 MG tablet  4 times daily     12/11/17 1019    acetaminophen (TYLENOL) 325 MG tablet  Every 6 hours PRN     12/11/17 1019       Kumari Sculley S, PA-C 12/11/17 1021    Little, Ambrose Finland, MD 12/11/17 [phone]

## 2017-12-11 NOTE — ED Triage Notes (Signed)
Rt. Upper molar  Is broken and is having pain and swelling. Pt. Also removed a tick from his private area.  He denies any swelling or redness at the site.  Pt. Stated, "I burned it off"  He denies any chills , fevers, or body aches,.

## 2017-12-11 NOTE — Discharge Instructions (Signed)
You were given a prescription for antibiotics. Please take the antibiotic prescription fully. Please follow up with a dentist.   I have prescribed a new medication for you today. It is important that when you pick the prescription up you discuss the potential interactions of this medication with other medications you are taking, including over the counter medications, with the pharmacists.   This new medication has potential side effects. Be sure to contact your primary care provider or return to the emergency department if you are experiencing new symptoms that you are unable to tolerate after starting the medication. You need to receive medical evaluation immediately if you start to experience blistering of the skin, rash, swelling, or difficulty breathing as these signs could indicate a more serious medication side effect.   Please follow up with your primary care provider within 5-7 days for re-evaluation of your symptoms. If you do not have a primary care provider, information for a healthcare clinic has been provided for you to make arrangements for follow up care. Please return to the emergency department for any new or worsening symptoms.

## 2017-12-12 ENCOUNTER — Encounter (HOSPITAL_COMMUNITY): Payer: Self-pay

## 2017-12-12 ENCOUNTER — Emergency Department (HOSPITAL_COMMUNITY)
Admission: EM | Admit: 2017-12-12 | Discharge: 2017-12-12 | Disposition: A | Discharge status: Home / Self Care | Payer: Self-pay | Attending: Emergency Medicine | Admitting: Emergency Medicine

## 2017-12-12 ENCOUNTER — Other Ambulatory Visit: Payer: Self-pay

## 2017-12-12 DIAGNOSIS — Z7722 Contact with and (suspected) exposure to environmental tobacco smoke (acute) (chronic): Secondary | ICD-10-CM | POA: Insufficient documentation

## 2017-12-12 DIAGNOSIS — Z79899 Other long term (current) drug therapy: Secondary | ICD-10-CM | POA: Insufficient documentation

## 2017-12-12 DIAGNOSIS — W57XXXD Bitten or stung by nonvenomous insect and other nonvenomous arthropods, subsequent encounter: Secondary | ICD-10-CM | POA: Insufficient documentation

## 2017-12-12 DIAGNOSIS — N5082 Scrotal pain: Secondary | ICD-10-CM | POA: Insufficient documentation

## 2017-12-12 DIAGNOSIS — R51 Headache: Secondary | ICD-10-CM | POA: Insufficient documentation

## 2017-12-12 DIAGNOSIS — R52 Pain, unspecified: Secondary | ICD-10-CM

## 2017-12-12 LAB — GROUP A STREP BY PCR: Group A Strep by PCR: NOT DETECTED

## 2017-12-12 MED ORDER — DOXYCYCLINE HYCLATE 100 MG PO CAPS: 100.0000 mg | ORAL_CAPSULE | Freq: Two times a day (BID) | ORAL | 0 refills | Status: AC

## 2017-12-12 MED FILL — DOXYCYCLINE HYCLATE 100 MG: 100 | 7 days supply | Qty: 14 | Fill #0

## 2017-12-12 NOTE — ED Provider Notes (Signed)
[facility] EMERGENCY DEPARTMENT Provider Note   CSN: [id number] Arrival date & time: 12/12/17  0908     History   Chief Complaint No chief complaint on file.   HPI Steve Wilson is a 22 y.o. male.  HPI   Patient is a 22 year old male presents the ED today to be evaluated for right-sided scrotal swelling that he noticed this morning.  Patient was seen in the ED by myself yesterday on 12/11/2017 and at that time had been evaluated for a tick bite to the right scrotum.  Patient had also complained of dental pain at that time.  This morning he woke up and noticed a small area of swelling and some associated itching to the skin where the tick was attached.  Denies any urinary symptoms or redness to the skin.  He also noted that he had a mild headache and sore throat this morning. He also reports myalgias and generalized fatigue.  States "I just feel weird ".  Denies any documented fevers, abdominal pain, nausea, vomiting, diarrhea, constipation.  No chest pain or shortness of breath.  No significant rashes to his body.  Has not tried taking any medications for his symptoms.  He did start taking the penicillin yesterday that he was given for his dental pain.  Past Medical History:  Diagnosis Date  . Meniscus tear    left knee    There are no active problems to display for this patient.   Past Surgical History:  Procedure Laterality Date  . KNEE SURGERY    . MENISECTOMY  06/28/2011   Procedure: MENISECTOMY;  Surgeon: Loreta Ave, MD;  Location: [facility];  Service: Orthopedics;;  lateral menisectomy        Home Medications    Prior to Admission medications   Medication Sig Start Date End Date Taking? Authorizing Provider  acetaminophen (TYLENOL) 325 MG tablet Take 2 tablets (650 mg total) by mouth every 6 (six) hours as needed. Do not take more than  of tylenol per day 12/11/17  Yes Katarzyna Wolven S, PA-C  penicillin v potassium  (VEETID) 500 MG tablet Take 1 tablet (500 mg total) by mouth 4 (four) times daily for 7 days. 12/11/17 12/18/17 Yes Joscelin Fray S, PA-C  doxycycline (VIBRAMYCIN) 100 MG capsule Take 1 capsule (100 mg total) by mouth 2 (two) times daily for 7 days. 12/12/17 12/19/17  Avon Mergenthaler S, PA-C  ondansetron (ZOFRAN ODT) 8 MG disintegrating tablet Take 1 tablet (8 mg total) by mouth every 8 (eight) hours as needed for nausea or vomiting. Patient not taking: Reported on 08/01/2017 12/27/15   Margarita Grizzle, MD    Family History Family History  Problem Relation Age of Onset  . Hypertension Mother   . Diabetes Maternal Grandmother   . Diabetes Paternal Grandmother     Social History Social History   Tobacco Use  . Smoking status: Passive Smoke Exposure - Never Smoker  . Smokeless tobacco: Never Used  . Tobacco comment: smokers smoke inside  Substance Use Topics  . Alcohol use: Yes    Comment: socially  . Drug use: No     Allergies   Patient has no known allergies.   Review of Systems Review of Systems  Constitutional: Positive for fatigue.  HENT: Positive for sore throat. Negative for congestion and sinus pain.   Eyes: Negative for pain and visual disturbance.  Respiratory: Negative for cough and shortness of breath.   Cardiovascular: Negative for chest pain.  Gastrointestinal: Negative for abdominal pain, constipation, diarrhea, nausea and vomiting.  Genitourinary: Positive for scrotal swelling.  Musculoskeletal: Positive for myalgias.  Skin: Positive for color change. Negative for rash.  Neurological: Positive for headaches. Negative for dizziness, weakness, light-headedness and numbness.     Physical Exam Updated Vital Signs BP 119/77   Pulse 74   Temp 98.1 F (36.7 C)   Resp 18   SpO2 100%   Physical Exam  Constitutional: He appears well-developed and well-nourished.  HENT:  Head: Normocephalic and atraumatic.  Right Ear: External ear normal.  Left Ear: External ear  normal.  No pharyngeal erythema.  No tonsillar swelling or exudates.  Uvula midline.  No cervical adenopathy.  Bilateral TMs are clear without erythema or exudate.  Eyes: Pupils are equal, round, and reactive to light. Conjunctivae and EOM are normal.  Neck: Normal range of motion. Neck supple.  No nuchal rigidity  Cardiovascular: Normal rate, regular rhythm and normal heart sounds.  No murmur heard. Pulmonary/Chest: Effort normal and breath sounds normal. No respiratory distress. He has no wheezes.  Abdominal: Soft. Bowel sounds are normal. He exhibits no distension. There is no tenderness. There is no guarding.  Genitourinary:  Genitourinary Comments: Chaperone present during GU exam.  Testicles and scrotum were examined, there is no significant erythema or swelling to the scrotum.  No tenderness to the testes bilaterally.  Normal-appearing scrotum and testicles.  Musculoskeletal: He exhibits no edema.  Lymphadenopathy:    He has no cervical adenopathy.  Neurological: He is alert.  Skin: Skin is warm and dry. Capillary refill takes less than 2 seconds.  No rashes noted  Psychiatric: He has a normal mood and affect.  Nursing note and vitals reviewed.   ED Treatments / Results  Labs (all labs ordered are listed, but only abnormal results are displayed) Labs Reviewed  GROUP A STREP BY PCR  ROCKY MTN SPOTTED FVR ABS PNL(IGG+IGM)  MISC [facility] TEST (SEND OUT)    EKG None  Radiology No results found.  Procedures Procedures (including critical care time)  Medications Ordered in ED Medications - No data to display   Initial Impression / Assessment and Plan / ED Course  I have reviewed the triage vital signs and the nursing notes.  Pertinent labs & imaging results that were available during my care of the patient were reviewed by me and considered in my medical decision making (see chart for details).   Final Clinical Impressions(s) / ED Diagnoses   Final diagnoses:    Generalized body aches  Tick bite, subsequent encounter   Patient presenting to the ED today with concern for rash and myalgias after tick bite that he noted yesterday.  He felt that he had swelling to the scrotal area.  This was not appreciated on exam.  His vital signs are stable and he is afebrile here.  I do not observe rashes on his body however given his complaints of myalgias and overall feeling tired will treat prophylactically for [facility] spotted fever.  Sent lab testing for RMSF and lyme disease. Negative strep test. Will give 7-day course of doxycycline.  Advised him to continue taking penicillin for his dental infection as well.  Advised him to follow-up with primary care for the results of his testing or to contact the hospital for this information.  Gave him strict instructions to return to the ER for any new or worsening symptoms in the meantime.  All questions answered and patient understands plan and reasons to return  immediately to the ED.  ED Discharge Orders        Ordered    doxycycline (VIBRAMYCIN) 100 MG capsule  2 times daily     12/12/17 1355       Kaede Clendenen S, PA-C 12/12/17 1401    Shaune Pollack, MD 12/14/17 [phone]

## 2017-12-12 NOTE — ED Triage Notes (Signed)
Patient here to have tick bite rechecked, seen yesterday in ED for same

## 2017-12-12 NOTE — Discharge Instructions (Signed)
You were given a prescription for antibiotics. Please take the antibiotic prescription fully.   I have prescribed a new medication for you today. It is important that when you pick the prescription up you discuss the potential interactions of this medication with other medications you are taking, including over the counter medications, with the pharmacists.   This new medication has potential side effects. Be sure to contact your primary care provider or return to the emergency department if you are experiencing new symptoms that you are unable to tolerate after starting the medication. You need to receive medical evaluation immediately if you start to experience blistering of the skin, rash, swelling, or difficulty breathing as these signs could indicate a more serious medication side effect.   Please follow up with your primary care provider within 5-7 days for re-evaluation of your symptoms. If you do not have a primary care provider, information for a healthcare clinic has been provided for you to make arrangements for follow up care. Please return to the emergency department for any new or worsening symptoms.

## 2017-12-13 LAB — ROCKY MTN SPOTTED FVR ABS PNL(IGG+IGM)
RMSF IGG: NEGATIVE
RMSF IgM: 1.6 index — ABNORMAL HIGH (ref 0.00–0.89)

## 2017-12-16 LAB — MISC LABCORP TEST (SEND OUT): LABCORP TEST CODE: 9985

## 2018-02-05 ENCOUNTER — Encounter (HOSPITAL_COMMUNITY): Payer: Self-pay

## 2018-02-05 ENCOUNTER — Other Ambulatory Visit: Payer: Self-pay

## 2018-02-05 ENCOUNTER — Emergency Department (HOSPITAL_COMMUNITY)
Admission: EM | Admit: 2018-02-05 | Discharge: 2018-02-05 | Disposition: A | Discharge status: Home / Self Care | Payer: No Typology Code available for payment source | Attending: Emergency Medicine | Admitting: Emergency Medicine

## 2018-02-05 DIAGNOSIS — S0003XA Contusion of scalp, initial encounter: Secondary | ICD-10-CM | POA: Insufficient documentation

## 2018-02-05 DIAGNOSIS — W208XXA Other cause of strike by thrown, projected or falling object, initial encounter: Secondary | ICD-10-CM | POA: Diagnosis not present

## 2018-02-05 DIAGNOSIS — Y9289 Other specified places as the place of occurrence of the external cause: Secondary | ICD-10-CM | POA: Diagnosis not present

## 2018-02-05 DIAGNOSIS — Y990 Civilian activity done for income or pay: Secondary | ICD-10-CM | POA: Insufficient documentation

## 2018-02-05 DIAGNOSIS — Y9389 Activity, other specified: Secondary | ICD-10-CM | POA: Insufficient documentation

## 2018-02-05 DIAGNOSIS — F17290 Nicotine dependence, other tobacco product, uncomplicated: Secondary | ICD-10-CM | POA: Diagnosis not present

## 2018-02-05 DIAGNOSIS — S0990XA Unspecified injury of head, initial encounter: Secondary | ICD-10-CM | POA: Diagnosis present

## 2018-02-05 MED ORDER — ACETAMINOPHEN 500 MG PO TABS
1000.0000 mg | ORAL_TABLET | Freq: Once | ORAL | Status: AC
  Administered 2018-02-05: 1000 mg via ORAL
  Filled 2018-02-05: qty 2

## 2018-02-05 NOTE — ED Triage Notes (Signed)
Patient reports that he hit his head on the left gate of an SUV at work. Patient denies any LOC or blood thinners. Patient c/o head pain. No hematoma or broken skin.

## 2018-02-05 NOTE — Discharge Instructions (Signed)
It was our pleasure to provide your ER care today - we hope that you feel better.  Icepack/cold to sore area.   Take acetaminophen and/or ibuprofen as need.   Return to ER if worse, severe pain, persistent vomiting, other concern.

## 2018-02-05 NOTE — ED Provider Notes (Signed)
[facility]-EMERGENCY DEPT Provider Note   CSN: [id number] Arrival date & time: 02/05/18  1119     History   Chief Complaint Chief Complaint  Patient presents with  . Head Injury    HPI Steve Wilson is a 22 y.o. male.  Patient s/p contusion to head/scalp at work just prior to arrival today. A lift gate of suv accidentally hit on head. Did not fall to ground. No loc. Pain to scalp area since, dull, moderate. No neck or back pain. No numbness/weakness. No severe headaches. No nausea or vomiting. Denies other pain or injury.   The history is provided by the patient.  Head Injury   Pertinent negatives include no numbness and no weakness.    Past Medical History:  Diagnosis Date  . Meniscus tear    left knee    There are no active problems to display for this patient.   Past Surgical History:  Procedure Laterality Date  . KNEE SURGERY    . MENISECTOMY  06/28/2011   Procedure: MENISECTOMY;  Surgeon: Loreta Aveaniel F Murphy, MD;  Location: [facility];  Service: Orthopedics;;  lateral menisectomy        Home Medications    Prior to Admission medications   Medication Sig Start Date End Date Taking? Authorizing Provider  acetaminophen (TYLENOL) 325 MG tablet Take 2 tablets (650 mg total) by mouth every 6 (six) hours as needed. Do not take more than 4000mg  of tylenol per day 12/11/17  Yes Couture, Cortni S, PA-C  ondansetron (ZOFRAN ODT) 8 MG disintegrating tablet Take 1 tablet (8 mg total) by mouth every 8 (eight) hours as needed for nausea or vomiting. Patient not taking: Reported on 08/01/2017 12/27/15   Margarita Grizzleay, Danielle, MD    Family History Family History  Problem Relation Age of Onset  . Hypertension Mother   . Diabetes Maternal Grandmother   . Diabetes Paternal Grandmother     Social History Social History   Tobacco Use  . Smoking status: Current Some Day Smoker    Types: Cigars  . Smokeless tobacco: Never Used  . Tobacco  comment: smokers smoke inside  Substance Use Topics  . Alcohol use: Yes    Comment: socially  . Drug use: No     Allergies   Patient has no known allergies.   Review of Systems Review of Systems  Constitutional: Negative for fever.  Eyes: Negative for visual disturbance.  Musculoskeletal: Negative for back pain and neck pain.  Skin: Negative for wound.  Neurological: Negative for weakness, numbness and headaches.     Physical Exam Updated Vital Signs BP 112/82 (BP Location: Right Arm)   Pulse (!) 55   Temp 98 F (36.7 C) (Oral)   Resp 18   Ht 2.007 m (6\' 7" )   Wt 99.8 kg (220 lb)   SpO2 100%   BMI 24.78 kg/m   Physical Exam  Constitutional: He is oriented to person, place, and time. He appears well-developed and well-nourished. No distress.  HENT:  Mild tenderness superior aspect scalp. Skin intact.   Eyes: Pupils are equal, round, and reactive to light.  Neck: Normal range of motion. Neck supple. No tracheal deviation present.  Cardiovascular: Normal rate, regular rhythm, normal heart sounds and intact distal pulses.  Pulmonary/Chest: Effort normal and breath sounds normal. No accessory muscle usage. No respiratory distress.  Abdominal: He exhibits no distension.  Musculoskeletal: He exhibits no edema.  Neurological: He is alert and oriented to person, place,  and time.  Speech normal/fluent. Motor intact bil, stre 5/5. Steady gait.   Skin: Skin is warm and dry.  Psychiatric: He has a normal mood and affect.  Nursing note and vitals reviewed.    ED Treatments / Results  Labs (all labs ordered are listed, but only abnormal results are displayed) Labs Reviewed - No data to display  EKG None  Radiology No results found.  Procedures Procedures (including critical care time)  Medications Ordered in ED Medications  acetaminophen (TYLENOL) tablet 1,000 mg (has no administration in time range)     Initial Impression / Assessment and Plan / ED Course    I have reviewed the triage vital signs and the nursing notes.  Pertinent labs & imaging results that were available during my care of the patient were reviewed by me and considered in my medical decision making (see chart for details).  No meds pta.  Hx/exam c/w scalp contusion.  Acetaminophen po. icepack to sore area.  Pt currently appears stable for d/c.     Final Clinical Impressions(s) / ED Diagnoses   Final diagnoses:  None    ED Discharge Orders    None       Cathren Laine, MD 02/05/18 1225

## 2018-03-08 ENCOUNTER — Encounter (HOSPITAL_COMMUNITY): Payer: Self-pay

## 2018-03-08 ENCOUNTER — Emergency Department (HOSPITAL_COMMUNITY)
Admission: EM | Admit: 2018-03-08 | Discharge: 2018-03-08 | Disposition: A | Discharge status: Home / Self Care | Payer: Medicaid Other | Attending: Emergency Medicine | Admitting: Emergency Medicine

## 2018-03-08 DIAGNOSIS — Z5321 Procedure and treatment not carried out due to patient leaving prior to being seen by health care provider: Secondary | ICD-10-CM | POA: Insufficient documentation

## 2018-03-08 DIAGNOSIS — R109 Unspecified abdominal pain: Secondary | ICD-10-CM | POA: Insufficient documentation

## 2018-03-08 LAB — LIPASE, BLOOD: Lipase: 29 U/L (ref 11–51)

## 2018-03-08 LAB — COMPREHENSIVE METABOLIC PANEL
ALBUMIN: 3.9 g/dL (ref 3.5–5.0)
ALK PHOS: 50 U/L (ref 38–126)
ALT: 18 U/L (ref 0–44)
AST: 27 U/L (ref 15–41)
Anion gap: 7 (ref 5–15)
BILIRUBIN TOTAL: 0.9 mg/dL (ref 0.3–1.2)
BUN: 9 mg/dL (ref 6–20)
CALCIUM: 9.1 mg/dL (ref 8.9–10.3)
CO2: 26 mmol/L (ref 22–32)
Chloride: 108 mmol/L (ref 98–111)
Creatinine, Ser: 1.02 mg/dL (ref 0.61–1.24)
GFR calc Af Amer: >60 mL/min (ref >60)
GFR calc non Af Amer: >60 mL/min (ref >60)
GLUCOSE: 89 mg/dL (ref 70–99)
POTASSIUM: 3.7 mmol/L (ref 3.5–5.1)
Sodium: 141 mmol/L (ref 135–145)
TOTAL PROTEIN: 6.9 g/dL (ref 6.5–8.1)

## 2018-03-08 LAB — CBC
HEMATOCRIT: 46.6 % (ref 39.0–52.0)
Hemoglobin: 15.4 g/dL (ref 13.0–17.0)
MCH: 30.3 pg (ref 26.0–34.0)
MCHC: 33 g/dL (ref 30.0–36.0)
MCV: 91.7 fL (ref 78.0–100.0)
Platelets: 211 10*3/uL (ref 150–400)
RBC: 5.08 MIL/uL (ref 4.22–5.81)
RDW: 11.5 % (ref 11.5–15.5)
WBC: 4.8 10*3/uL (ref 4.0–10.5)

## 2018-03-08 NOTE — ED Notes (Signed)
Patient had to leave to pick his daughter up couldn't wait to see the daughter patient stated that he would return later

## 2018-03-08 NOTE — ED Triage Notes (Signed)
Onset last night abd pain, vomiting x 5,diarrhea x 2. Abd pain relieved with vomiting and having BM.

## 2018-04-13 ENCOUNTER — Emergency Department (HOSPITAL_COMMUNITY)
Admission: EM | Admit: 2018-04-13 | Discharge: 2018-04-13 | Disposition: A | Discharge status: Home / Self Care | Payer: Medicaid Other | Attending: Emergency Medicine | Admitting: Emergency Medicine

## 2018-04-13 ENCOUNTER — Encounter (HOSPITAL_COMMUNITY): Payer: Self-pay | Admitting: Emergency Medicine

## 2018-04-13 ENCOUNTER — Other Ambulatory Visit: Payer: Self-pay

## 2018-04-13 DIAGNOSIS — K2920 Alcoholic gastritis without bleeding: Secondary | ICD-10-CM | POA: Insufficient documentation

## 2018-04-13 DIAGNOSIS — F17210 Nicotine dependence, cigarettes, uncomplicated: Secondary | ICD-10-CM | POA: Insufficient documentation

## 2018-04-13 LAB — COMPREHENSIVE METABOLIC PANEL
ALK PHOS: 52 U/L (ref 38–126)
ALT: 40 U/L (ref 0–44)
ANION GAP: 10 (ref 5–15)
AST: 45 U/L — ABNORMAL HIGH (ref 15–41)
Albumin: 4.1 g/dL (ref 3.5–5.0)
BILIRUBIN TOTAL: 1.1 mg/dL (ref 0.3–1.2)
BUN: 8 mg/dL (ref 6–20)
CALCIUM: 9.4 mg/dL (ref 8.9–10.3)
CO2: 26 mmol/L (ref 22–32)
Chloride: 107 mmol/L (ref 98–111)
Creatinine, Ser: 1.14 mg/dL (ref 0.61–1.24)
GLUCOSE: 104 mg/dL — AB (ref 70–99)
Potassium: 3.9 mmol/L (ref 3.5–5.1)
Sodium: 143 mmol/L (ref 135–145)
TOTAL PROTEIN: 7.5 g/dL (ref 6.5–8.1)

## 2018-04-13 LAB — CBC
HCT: 45.6 % (ref 39.0–52.0)
HEMOGLOBIN: 14.9 g/dL (ref 13.0–17.0)
MCH: 30.1 pg (ref 26.0–34.0)
MCHC: 32.7 g/dL (ref 30.0–36.0)
MCV: 92.1 fL (ref 78.0–100.0)
Platelets: 214 10*3/uL (ref 150–400)
RBC: 4.95 MIL/uL (ref 4.22–5.81)
RDW: 11.8 % (ref 11.5–15.5)
WBC: 5.9 10*3/uL (ref 4.0–10.5)

## 2018-04-13 LAB — URINALYSIS, ROUTINE W REFLEX MICROSCOPIC
BILIRUBIN URINE: NEGATIVE
Glucose, UA: NEGATIVE mg/dL
Hgb urine dipstick: NEGATIVE
KETONES UR: NEGATIVE mg/dL
Leukocytes, UA: NEGATIVE
NITRITE: NEGATIVE
Protein, ur: NEGATIVE mg/dL
Specific Gravity, Urine: 1.021 (ref 1.005–1.030)
pH: 7 (ref 5.0–8.0)

## 2018-04-13 LAB — MAGNESIUM: MAGNESIUM: 1.7 mg/dL (ref 1.7–2.4)

## 2018-04-13 LAB — LIPASE, BLOOD: Lipase: 29 U/L (ref 11–51)

## 2018-04-13 MED ORDER — LACTATED RINGERS IV BOLUS
1000.0000 mL | Freq: Once | INTRAVENOUS | Status: AC
  Administered 2018-04-13: 1000 mL via INTRAVENOUS

## 2018-04-13 MED ORDER — SUCRALFATE 1 G PO TABS: 1.0000 g | ORAL_TABLET | Freq: Three times a day (TID) | ORAL | 0 refills | Status: DC

## 2018-04-13 MED ORDER — ONDANSETRON HCL 4 MG/2ML IJ SOLN
4.0000 mg | Freq: Once | INTRAMUSCULAR | Status: AC
  Administered 2018-04-13: 4 mg via INTRAVENOUS
  Filled 2018-04-13: qty 2

## 2018-04-13 MED ORDER — SODIUM CHLORIDE 0.9 % IV BOLUS
1000.0000 mL | Freq: Once | INTRAVENOUS | Status: AC
  Administered 2018-04-13: 1000 mL via INTRAVENOUS

## 2018-04-13 MED ORDER — RANITIDINE HCL 150 MG PO CAPS: 150.0000 mg | ORAL_CAPSULE | Freq: Two times a day (BID) | ORAL | 0 refills | Status: DC

## 2018-04-13 MED ORDER — DICYCLOMINE HCL 10 MG PO CAPS
20.0000 mg | ORAL_CAPSULE | Freq: Once | ORAL | Status: AC
  Administered 2018-04-13: 20 mg via ORAL
  Filled 2018-04-13: qty 2

## 2018-04-13 MED ORDER — ONDANSETRON 4 MG PO TBDP: 4.0000 mg | ORAL_TABLET | Freq: Three times a day (TID) | ORAL | 0 refills | Status: DC | PRN

## 2018-04-13 MED ORDER — KETOROLAC TROMETHAMINE 15 MG/ML IJ SOLN
15.0000 mg | Freq: Once | INTRAMUSCULAR | Status: DC
  Filled 2018-04-13: qty 1

## 2018-04-13 MED ORDER — MORPHINE SULFATE (PF) 4 MG/ML IV SOLN
4.0000 mg | Freq: Once | INTRAVENOUS | Status: AC
Start: 2018-04-13 — End: 2018-04-13
  Administered 2018-04-13: 4 mg via INTRAVENOUS
  Filled 2018-04-13: qty 1

## 2018-04-13 NOTE — ED Notes (Signed)
Pt in bathroom

## 2018-04-13 NOTE — ED Notes (Signed)
Called for triage.  Pt in bathroom. 

## 2018-04-13 NOTE — ED Triage Notes (Signed)
C/o LLQ cramping, nausea, vomiting, and diarrhea that started earlier today.

## 2018-04-13 NOTE — ED Provider Notes (Signed)
[facility] EMERGENCY DEPARTMENT Provider Note   CSN: [id number] Arrival date & time: 04/13/18  1520     History   Chief Complaint Chief Complaint  Patient presents with  . Abdominal Pain  . Emesis  . Diarrhea    HPI Steve Wilson is a 22 y.o. male.  HPI 22 year old male with no significant past medical history here with nausea, vomiting, diffuse abdominal pain.  The patient states that he was in his usual state of health yesterday.  He drank significant amounts of alcohol with friends.  He awoke and developed severe, epigastric and lower abdominal pain.  He then began vomiting.  He has had innumerable episodes of nonbloody, nonbilious emesis throughout the day today.  Is also had watery diarrhea.  He said chills, shakes, and general fatigue.  He has not had any fevers that he is aware of.  Denies any urinary symptoms.  Denies any blood in his emesis or diarrhea.  Denies known sick contacts or suspicious food intake.  He has no history of angry otitis in the past.  Pain is aching, throbbing, generalized, and worse after vomiting.  Also worse with movement.  No relieving factors.  Is been unable to eat or drink today.   Past Medical History:  Diagnosis Date  . Meniscus tear    left knee    There are no active problems to display for this patient.   Past Surgical History:  Procedure Laterality Date  . KNEE SURGERY    . MENISECTOMY  06/28/2011   Procedure: MENISECTOMY;  Surgeon: Loreta Ave, MD;  Location: [facility];  Service: Orthopedics;;  lateral menisectomy        Home Medications    Prior to Admission medications   Medication Sig Start Date End Date Taking? Authorizing Provider  acetaminophen (TYLENOL) 325 MG tablet Take 2 tablets (650 mg total) by mouth every 6 (six) hours as needed. Do not take more than 4000mg  of tylenol per day Patient not taking: Reported on 04/13/2018 12/11/17   Couture, Cortni S, PA-C  ondansetron  (ZOFRAN ODT) 4 MG disintegrating tablet Take 1 tablet (4 mg total) by mouth every 8 (eight) hours as needed for nausea or vomiting. 04/13/18   Shaune Pollack, MD  ranitidine (ZANTAC) 150 MG capsule Take 1 capsule (150 mg total) by mouth 2 (two) times daily for 5 days. 04/13/18 04/18/18  Shaune Pollack, MD  sucralfate (CARAFATE) 1 g tablet Take 1 tablet (1 g total) by mouth 4 (four) times daily -  with meals and at bedtime for 5 days. 04/13/18 04/18/18  Shaune Pollack, MD    Family History Family History  Problem Relation Age of Onset  . Hypertension Mother   . Diabetes Maternal Grandmother   . Diabetes Paternal Grandmother     Social History Social History   Tobacco Use  . Smoking status: Current Some Day Smoker    Types: Cigars  . Smokeless tobacco: Never Used  . Tobacco comment: smokers smoke inside  Substance Use Topics  . Alcohol use: Yes    Comment: socially  . Drug use: Yes    Types: Marijuana     Allergies   Nickel   Review of Systems Review of Systems  Constitutional: Positive for fatigue. Negative for chills and fever.  HENT: Negative for congestion and rhinorrhea.   Eyes: Negative for visual disturbance.  Respiratory: Negative for cough, shortness of breath and wheezing.   Cardiovascular: Negative for chest pain and leg  swelling.  Gastrointestinal: Positive for abdominal pain, diarrhea, nausea and vomiting.  Genitourinary: Negative for dysuria and flank pain.  Musculoskeletal: Positive for myalgias. Negative for neck pain and neck stiffness.  Skin: Negative for rash and wound.  Allergic/Immunologic: Negative for immunocompromised state.  Neurological: Negative for syncope, weakness and headaches.  All other systems reviewed and are negative.    Physical Exam Updated Vital Signs BP (!) 155/81   Pulse (!) 47   Temp (!) 97.4 F (36.3 C) (Oral)   Resp 15   Ht 6\' 7"  (2.007 m)   Wt 99.8 kg   SpO2 100%   BMI 24.78 kg/m   Physical Exam  Constitutional: He is  oriented to person, place, and time. He appears well-developed and well-nourished. No distress.  HENT:  Head: Normocephalic and atraumatic.  Markedly dry mucous membranes  Eyes: Conjunctivae are normal.  Neck: Neck supple.  Cardiovascular: Normal rate, regular rhythm and normal heart sounds. Exam reveals no friction rub.  No murmur heard. Pulmonary/Chest: Effort normal and breath sounds normal. No respiratory distress. He has no wheezes. He has no rales.  Abdominal: He exhibits no distension.  Soft, diffusely tender, without rebound or guarding.  Tenderness is worse with flexion of the abdomen.  No rebound or guarding.  Musculoskeletal: He exhibits no edema.  Neurological: He is alert and oriented to person, place, and time. He exhibits normal muscle tone.  Skin: Skin is warm. Capillary refill takes less than 2 seconds.  Psychiatric: He has a normal mood and affect.  Nursing note and vitals reviewed.    ED Treatments / Results  Labs (all labs ordered are listed, but only abnormal results are displayed) Labs Reviewed  COMPREHENSIVE METABOLIC PANEL - Abnormal; Notable for the following components:      Result Value   Glucose, Bld 104 (*)    AST 45 (*)    All other components within normal limits  LIPASE, BLOOD  CBC  URINALYSIS, ROUTINE W REFLEX MICROSCOPIC  MAGNESIUM    EKG EKG Interpretation  Date/Time:  "Sunday April 13 2018 16:43:47 EDT Ventricular Rate:  46 PR Interval:    QRS Duration: 108 QT Interval:  434 QTC Calculation: 380 R Axis:   94 Text Interpretation:  Sinus bradycardia Borderline right axis deviation RSR' in V1 or V2, probably normal variant ST elev, probable normal early repol pattern Baseline wander in lead(s) V6 No significant change since last tracing Confirmed by Jaques Mineer ([id number]) on 04/13/2018 4:58:57 PM   Radiology No results found.  Procedures Procedures (including critical care time)  Medications Ordered in ED Medications  ketorolac  (TORADOL) 15 MG/ML injection 15 mg (15 mg Intravenous Refused 04/13/18 1851)  sodium chloride 0.9 % bolus 1,000 mL (0 mLs Intravenous Stopped 04/13/18 1729)  ondansetron (ZOFRAN) injection 4 mg (4 mg Intravenous Given 04/13/18 1614)  lactated ringers bolus 1,000 mL (1,000 mLs Intravenous New Bag/Given 04/13/18 1729)  morphine 4 MG/ML injection 4 mg (4 mg Intravenous Given 04/13/18 1742)  dicyclomine (BENTYL) capsule 20 mg (20 mg Oral Given 04/13/18 1741)     Initial Impression / Assessment and Plan / ED Course  I have reviewed the triage vital signs and the nursing notes.  Pertinent labs & imaging results that were available during my care of the patient were reviewed by me and considered in my medical decision making (see chart for details).     22"  year old male here with abdominal pain, nausea, and vomiting after drinking alcohol heavily.  I suspect he has  alcoholic gastritis.  His abdomen is soft and nontender.  He has no fever, no leukocytosis, or other evidence to suggest intra-abdominal emergency.  Following IV fluids, and symptomatic control, his symptoms are completely resolved and he has no abdominal pain.  Serial exams are benign.  His CMP and urinalysis are largely unremarkable.  AST mildly elevated likely secondary to alcohol use.  Given reassuring exams and resolution of pain, do not feel repeat or further imaging is indicated.  Lipase normal and I do not suspect pancreatitis.  Will treat supportively and discharged with good return precautions.  Final Clinical Impressions(s) / ED Diagnoses   Final diagnoses:  Acute alcoholic gastritis without hemorrhage    ED Discharge Orders         Ordered    ondansetron (ZOFRAN ODT) 4 MG disintegrating tablet  Every 8 hours PRN     04/13/18 1937    ranitidine (ZANTAC) 150 MG capsule  2 times daily     04/13/18 1937    sucralfate (CARAFATE) 1 g tablet  3 times daily with meals & bedtime     04/13/18 1937           Shaune Pollack,  MD 04/13/18 [phone]

## 2019-01-17 ENCOUNTER — Encounter (HOSPITAL_COMMUNITY): Payer: Self-pay

## 2019-01-17 ENCOUNTER — Emergency Department (HOSPITAL_COMMUNITY)
Admission: EM | Admit: 2019-01-17 | Discharge: 2019-01-17 | Disposition: A | Discharge status: Home / Self Care | Payer: Medicaid Other | Attending: Emergency Medicine | Admitting: Emergency Medicine

## 2019-01-17 ENCOUNTER — Other Ambulatory Visit: Payer: Self-pay

## 2019-01-17 DIAGNOSIS — K0889 Other specified disorders of teeth and supporting structures: Secondary | ICD-10-CM | POA: Insufficient documentation

## 2019-01-17 DIAGNOSIS — F17290 Nicotine dependence, other tobacco product, uncomplicated: Secondary | ICD-10-CM | POA: Insufficient documentation

## 2019-01-17 DIAGNOSIS — F1290 Cannabis use, unspecified, uncomplicated: Secondary | ICD-10-CM | POA: Insufficient documentation

## 2019-01-17 MED ORDER — CLINDAMYCIN HCL 300 MG PO CAPS: 300.0000 mg | ORAL_CAPSULE | Freq: Three times a day (TID) | ORAL | 0 refills | Status: AC

## 2019-01-17 MED ORDER — IBUPROFEN 600 MG PO TABS: 600.0000 mg | ORAL_TABLET | Freq: Four times a day (QID) | ORAL | 0 refills | Status: AC | PRN

## 2019-01-17 NOTE — Discharge Instructions (Signed)
I have provided a prescription for antibiotics, please take 1 tablet 3 times a day for the next 7 days.  I have also provided pain medication, please take this as needed.  There is a list of dentists attached to this paperwork, please schedule an appointment to ultimately have dental follow-up.

## 2019-01-17 NOTE — ED Provider Notes (Signed)
[facility]GENC[facility] Provider Note   CSN: [id number] Arrival date & time: 01/17/19  1158    History   Chief Complaint Chief Complaint  Patient presents with  . Dental Pain    HPI Steve Wilson is a 23 y.o. male.     [age] y.o male with no PMH presents to the ED with a chief complaint of left upper anterior tooth pain x today. Patient reports he had a filling done years ago and that his filling fell off several years ago, he reports since food has been getting stuck there and he feels like now there is an infection on his tooth.  He reports pain with mastication, reports waking up with swelling to his face.  Pain is worse with mastication.  He has taken a Norco 5 along with some BC powders for his pain with improvement in symptoms.  He denies any facial swelling, fevers, difficulty swallowing.     Past Medical History:  Diagnosis Date  . Meniscus tear    left knee    There are no active problems to display for this patient.   Past Surgical History:  Procedure Laterality Date  . KNEE SURGERY    . MENISECTOMY  06/28/2011   Procedure: MENISECTOMY;  Surgeon: Loreta Aveaniel F Murphy, MD;  Location: [facility];  Service: Orthopedics;;  lateral menisectomy        Home Medications    Prior to Admission medications   Medication Sig Start Date End Date Taking? Authorizing Provider  acetaminophen (TYLENOL) 325 MG tablet Take 2 tablets (650 mg total) by mouth every 6 (six) hours as needed. Do not take more than 4000mg  of tylenol per day Patient not taking: Reported on 04/13/2018 12/11/17   Couture, Cortni S, PA-C  clindamycin (CLEOCIN) 300 MG capsule Take 1 capsule (300 mg total) by mouth 3 (three) times daily for 7 days. 01/17/19 01/24/19  Claude MangesSoto, Javen Hinderliter, PA-C  ibuprofen (ADVIL) 600 MG tablet Take 1 tablet (600 mg total) by mouth every 6 (six) hours as needed for up to 7 days. 01/17/19 01/24/19  Claude MangesSoto, Hilaria Titsworth, PA-C  ondansetron (ZOFRAN ODT) 4 MG  disintegrating tablet Take 1 tablet (4 mg total) by mouth every 8 (eight) hours as needed for nausea or vomiting. 04/13/18   Shaune PollackIsaacs, Cameron, MD  ranitidine (ZANTAC) 150 MG capsule Take 1 capsule (150 mg total) by mouth 2 (two) times daily for 5 days. 04/13/18 04/18/18  Shaune PollackIsaacs, Cameron, MD  sucralfate (CARAFATE) 1 g tablet Take 1 tablet (1 g total) by mouth 4 (four) times daily -  with meals and at bedtime for 5 days. 04/13/18 04/18/18  Shaune PollackIsaacs, Cameron, MD    Family History Family History  Problem Relation Age of Onset  . Hypertension Mother   . Diabetes Maternal Grandmother   . Diabetes Paternal Grandmother     Social History Social History   Tobacco Use  . Smoking status: Current Some Day Smoker    Types: Cigars  . Smokeless tobacco: Never Used  . Tobacco comment: smokers smoke inside  Substance Use Topics  . Alcohol use: Yes    Comment: socially  . Drug use: Yes    Types: Marijuana     Allergies   Nickel   Review of Systems Review of Systems  Constitutional: Negative for fever.  HENT: Positive for dental problem.      Physical Exam Updated Vital Signs BP (!) 151/100   Pulse 60   Temp 98.2 F (36.8 C) (  Oral)   Resp 16   SpO2 99%   Physical Exam Vitals signs and nursing note reviewed.  Constitutional:      Appearance: He is well-developed.  HENT:     Head: Normocephalic and atraumatic.     Comments: No facial swelling,no adenopathy.     Mouth/Throat:     Lips: Pink.     Mouth: Mucous membranes are moist.     Tongue: No lesions.     Pharynx: Oropharynx is clear.      Comments: Poor dentition throughout, multiple missing teeth.  Eyes:     General: No scleral icterus.    Pupils: Pupils are equal, round, and reactive to light.  Neck:     Musculoskeletal: Normal range of motion.  Cardiovascular:     Heart sounds: Normal heart sounds.  Pulmonary:     Effort: Pulmonary effort is normal.     Breath sounds: Normal breath sounds. No wheezing.  Chest:     Chest  wall: No tenderness.  Abdominal:     General: Bowel sounds are normal. There is no distension.     Palpations: Abdomen is soft.     Tenderness: There is no abdominal tenderness.  Musculoskeletal:        General: No tenderness or deformity.  Skin:    General: Skin is warm and dry.  Neurological:     Mental Status: He is alert and oriented to person, place, and time.      ED Treatments / Results  Labs (all labs ordered are listed, but only abnormal results are displayed) Labs Reviewed - No data to display  EKG None  Radiology No results found.  Procedures Procedures (including critical care time)  Medications Ordered in ED Medications - No data to display   Initial Impression / Assessment and Plan / ED Course  I have reviewed the triage vital signs and the nursing notes.  Pertinent labs & imaging results that were available during my care of the patient were reviewed by me and considered in my medical decision making (see chart for details).    Patient with a past medical history presents to the ED with complaints of dental pain, this began this morning.  Has taking Norco along with BC powder for improvement in symptoms, this has helped with his symptoms.  He reports having a filling done a few years back, this fell off and has now been accumulating large amounts of food while he eats.  During evaluation no present abscess but there is surrounding erythema to the tooth along with poor dentition throughout the mouth.  Feel as though it would be indicated for patient to be treated prophylactically at this time to prevent any further infection.  He also be provided with pain medication, he is instructed that he will ultimately need a dentist to follow-up with.  Patient shows and agrees with management at this time, has taken Norco an hour prior to arrival, no medication will be given during ED visit.  Return precautions discussed at length.    Portions of this note were  generated with Scientist, clinical (histocompatibility and immunogenetics)Dragon dictation software. Dictation errors may occur despite best attempts at proofreading.   Final Clinical Impressions(s) / ED Diagnoses   Final diagnoses:  Pain, dental    ED Discharge Orders         Ordered    ibuprofen (ADVIL) 600 MG tablet  Every 6 hours PRN     01/17/19 1244    clindamycin (CLEOCIN) 300 MG capsule  3 times daily     01/17/19 1244           Janeece Fitting, PA-C 01/17/19 1247    Gareth Morgan, MD 01/18/19 [phone]

## 2019-01-17 NOTE — ED Notes (Signed)
Patient verbalizes understanding of discharge instructions . Opportunity for questions and answers were provided . Armband removed by staff ,Pt discharged from ED. W/C  offered at D/C  and Declined W/C at D/C and was escorted to lobby by RN.  

## 2019-01-17 NOTE — ED Triage Notes (Signed)
Onset this morning left upper molar pain and swelling. Pt has hole in tooth.

## 2019-08-25 ENCOUNTER — Other Ambulatory Visit: Payer: Self-pay

## 2019-08-25 ENCOUNTER — Ambulatory Visit (HOSPITAL_COMMUNITY)
Admission: EM | Admit: 2019-08-25 | Discharge: 2019-08-25 | Disposition: A | Discharge status: Home / Self Care | Payer: Medicaid Other | Attending: Family Medicine | Admitting: Family Medicine

## 2019-08-25 ENCOUNTER — Encounter (HOSPITAL_COMMUNITY): Payer: Self-pay

## 2019-08-25 DIAGNOSIS — Z8249 Family history of ischemic heart disease and other diseases of the circulatory system: Secondary | ICD-10-CM | POA: Diagnosis not present

## 2019-08-25 DIAGNOSIS — J029 Acute pharyngitis, unspecified: Secondary | ICD-10-CM | POA: Diagnosis present

## 2019-08-25 DIAGNOSIS — R05 Cough: Secondary | ICD-10-CM | POA: Diagnosis present

## 2019-08-25 DIAGNOSIS — Z20822 Contact with and (suspected) exposure to covid-19: Secondary | ICD-10-CM | POA: Insufficient documentation

## 2019-08-25 DIAGNOSIS — Z91048 Other nonmedicinal substance allergy status: Secondary | ICD-10-CM | POA: Insufficient documentation

## 2019-08-25 DIAGNOSIS — F17290 Nicotine dependence, other tobacco product, uncomplicated: Secondary | ICD-10-CM | POA: Diagnosis not present

## 2019-08-25 DIAGNOSIS — R519 Headache, unspecified: Secondary | ICD-10-CM | POA: Diagnosis not present

## 2019-08-25 DIAGNOSIS — R059 Cough, unspecified: Secondary | ICD-10-CM

## 2019-08-25 DIAGNOSIS — R5383 Other fatigue: Secondary | ICD-10-CM | POA: Diagnosis present

## 2019-08-25 DIAGNOSIS — Z833 Family history of diabetes mellitus: Secondary | ICD-10-CM | POA: Insufficient documentation

## 2019-08-25 HISTORY — DX: Cardiac murmur, unspecified: R01.1

## 2019-08-25 MED ORDER — BENZONATATE 100 MG PO CAPS: 100.0000 mg | ORAL_CAPSULE | Freq: Three times a day (TID) | ORAL | 0 refills | Status: DC

## 2019-08-25 NOTE — ED Provider Notes (Signed)
[facility]   [id number] 08/25/19 Arrival Time: 1103  ASSESSMENT & PLAN:  1. Sore throat   2. Cough   3. Fatigue, unspecified type      COVID-19 testing sent. See letter/work note on file for self-isolation guidelines.  Meds ordered this encounter  Medications  . benzonatate (TESSALON) 100 MG capsule    Sig: Take 1 capsule (100 mg total) by mouth every 8 (eight) hours.    Dispense:  21 capsule    Refill:  0    Follow-up Information    [facility].   Specialty: Urgent Care Why: As needed. Contact information: [address] [place] [address] [phone]          Reviewed expectations re: course of current medical issues. Questions answered. Outlined signs and symptoms indicating need for more acute intervention. Patient verbalized understanding. After Visit Summary given.   SUBJECTIVE: History from: patient. Steve Wilson is a 24 y.o. male who reports ST, dry cough, headache. Fairly abrupt onset; approx 2 days. Known COVID-19 contact: none. Recent travel: none. Denies: fever and difficulty breathing. Normal PO intake without n/v/d.  ROS: As per HPI.   OBJECTIVE:  Vitals:   08/25/19 1255  BP: 132/66  Pulse: 78  Resp: 18  Temp: 98.1 F (36.7 C)  TempSrc: Oral  SpO2: 100%    General appearance: alert; no distress Eyes: PERRLA; EOMI; conjunctiva normal HENT: [place]; AT; nasal mucosa normal; oral mucosa normal; throat with mild irritation/cobblestoning Neck: supple  Lungs: speaks full sentences without difficulty; unlabored Extremities: no edema Skin: warm and dry Neurologic: normal gait Psychological: alert and cooperative; normal mood and affect  Labs:  Labs Reviewed  NOVEL CORONAVIRUS, NAA (HOSP ORDER, SEND-OUT TO REF LAB; TAT 18-24 HRS)     Allergies  Allergen Reactions  . Nickel Rash    Past Medical History:  Diagnosis Date  . Heart murmur    requires pre-med abx  tx for dental tx, etc  . Meniscus tear    left knee   Social History   Socioeconomic History  . Marital status: Single    Spouse name: Not on file  . Number of children: Not on file  . Years of education: Not on file  . Highest education level: Not on file  Occupational History  . Not on file  Tobacco Use  . Smoking status: Current Some Day Smoker    Types: Cigars  . Smokeless tobacco: Never Used  . Tobacco comment: smokers smoke inside  Substance and Sexual Activity  . Alcohol use: Yes    Comment: socially  . Drug use: Yes    Types: Marijuana  . Sexual activity: Yes    Birth control/protection: None  Other Topics Concern  . Not on file  Social History Narrative  . Not on file   Social Determinants of Health   Financial Resource Strain:   . Difficulty of Paying Living Expenses: Not on file  Food Insecurity:   . Worried About Programme researcher, broadcasting/film/video in the Last Year: Not on file  . Ran Out of Food in the Last Year: Not on file  Transportation Needs:   . Lack of Transportation (Medical): Not on file  . Lack of Transportation (Non-Medical): Not on file  Physical Activity:   . Days of Exercise per Week: Not on file  . Minutes of Exercise per Session: Not on file  Stress:   . Feeling of Stress : Not on  file  Social Connections:   . Frequency of Communication with Friends and Family: Not on file  . Frequency of Social Gatherings with Friends and Family: Not on file  . Attends Religious Services: Not on file  . Active Member of Clubs or Organizations: Not on file  . Attends Archivist Meetings: Not on file  . Marital Status: Not on file  Intimate Partner Violence:   . Fear of Current or Ex-Partner: Not on file  . Emotionally Abused: Not on file  . Physically Abused: Not on file  . Sexually Abused: Not on file   Family History  Problem Relation Age of Onset  . Hypertension Mother   . Diabetes Maternal Grandmother   . Diabetes Paternal Grandmother    Past  Surgical History:  Procedure Laterality Date  . KNEE SURGERY    . MENISECTOMY  06/28/2011   Procedure: MENISECTOMY;  Surgeon: Ninetta Lights, MD;  Location: [place];  Service: Orthopedics;;  lateral menisectomy     Vanessa Kick, MD 08/25/19 1327

## 2019-08-25 NOTE — ED Triage Notes (Signed)
Pt c/o sore throat, congestion, HA, cough, general weakness/fatigue x2 days. Denies fever, chills, abd pain, or n/v/d.  Has h/o strep throat.  Took tylenol yesterday.

## 2019-08-25 NOTE — Discharge Instructions (Signed)
You have been tested for COVID-19 today. If your test is positive, you will receive a phone call from [place] regarding your results. Negative test results are not called. Both positive and negative results area always visible on MyChart. If you do not have a MyChart account, sign up instructions are in your discharge papers.  

## 2019-08-27 LAB — NOVEL CORONAVIRUS, NAA (HOSP ORDER, SEND-OUT TO REF LAB; TAT 18-24 HRS): SARS-CoV-2, NAA: NOT DETECTED

## 2020-02-13 ENCOUNTER — Emergency Department (HOSPITAL_COMMUNITY)
Admission: EM | Admit: 2020-02-13 | Discharge: 2020-02-13 | Disposition: A | Discharge status: Home / Self Care | Payer: Medicaid Other | Attending: Emergency Medicine | Admitting: Emergency Medicine

## 2020-02-13 ENCOUNTER — Other Ambulatory Visit: Payer: Self-pay

## 2020-02-13 ENCOUNTER — Encounter (HOSPITAL_COMMUNITY): Payer: Self-pay | Admitting: Emergency Medicine

## 2020-02-13 DIAGNOSIS — N489 Disorder of penis, unspecified: Secondary | ICD-10-CM

## 2020-02-13 DIAGNOSIS — F17290 Nicotine dependence, other tobacco product, uncomplicated: Secondary | ICD-10-CM | POA: Insufficient documentation

## 2020-02-13 DIAGNOSIS — N509 Disorder of male genital organs, unspecified: Secondary | ICD-10-CM | POA: Insufficient documentation

## 2020-02-13 LAB — HIV ANTIBODY (ROUTINE TESTING W REFLEX): HIV Screen 4th Generation wRfx: REACTIVE — AB

## 2020-02-13 NOTE — ED Notes (Signed)
Patient verbalizes understanding of discharge instructions . Opportunity for questions and answers were provided . Armband removed by staff ,Pt discharged from ED. W/C  offered at D/C  and Declined W/C at D/C and was escorted to lobby by RN.  

## 2020-02-13 NOTE — ED Provider Notes (Signed)
[facility] EMERGENCY DEPARTMENT Provider Note   CSN: [id number] Arrival date & time: 02/13/20  1218     History Chief Complaint  Patient presents with  . SEXUALLY TRANSMITTED DISEASE  . Rash    Steve Wilson is a 24 y.o. male.  HPI 24 year old male presents with bumps to his penis.  Ongoing for about a month.  They are not painful and there is no drainage.  They have not gotten bigger or smaller.  He is concern for STI.  He has been having unprotected sex.  No dysuria or penile discharge.  No testicular pain or abdominal pain or lymphadenopathy.   Past Medical History:  Diagnosis Date  . Heart murmur    requires pre-med abx tx for dental tx, etc  . Meniscus tear    left knee    There are no problems to display for this patient.   Past Surgical History:  Procedure Laterality Date  . KNEE SURGERY    . MENISECTOMY  06/28/2011   Procedure: MENISECTOMY;  Surgeon: Loreta Ave, MD;  Location: [facility];  Service: Orthopedics;;  lateral menisectomy       Family History  Problem Relation Age of Onset  . Hypertension Mother   . Diabetes Maternal Grandmother   . Diabetes Paternal Grandmother     Social History   Tobacco Use  . Smoking status: Current Some Day Smoker    Types: Cigars  . Smokeless tobacco: Never Used  . Tobacco comment: smokers smoke inside  Vaping Use  . Vaping Use: Never used  Substance Use Topics  . Alcohol use: Yes    Comment: socially  . Drug use: Yes    Types: Marijuana    Home Medications Prior to Admission medications   Medication Sig Start Date End Date Taking? Authorizing Provider  acetaminophen (TYLENOL) 325 MG tablet Take 2 tablets (650 mg total) by mouth every 6 (six) hours as needed. Do not take more than 4000mg  of tylenol per day 12/11/17   Couture, Cortni S, PA-C  benzonatate (TESSALON) 100 MG capsule Take 1 capsule (100 mg total) by mouth every 8 (eight) hours. 08/25/19   10/23/19,  MD  ranitidine (ZANTAC) 150 MG capsule Take 1 capsule (150 mg total) by mouth 2 (two) times daily for 5 days. 04/13/18 08/25/19  10/23/19, MD  sucralfate (CARAFATE) 1 g tablet Take 1 tablet (1 g total) by mouth 4 (four) times daily -  with meals and at bedtime for 5 days. 04/13/18 08/25/19  10/23/19, MD    Allergies    Nickel  Review of Systems   Review of Systems  Gastrointestinal: Negative for abdominal pain.  Genitourinary: Negative for discharge, dysuria, penile pain, penile swelling and testicular pain.  Skin: Positive for rash.    Physical Exam Updated Vital Signs BP 127/74 (BP Location: Right Arm)   Pulse (!) 51   Temp 98.9 F (37.2 C) (Oral)   Resp 15   SpO2 98%   Physical Exam Vitals and nursing note reviewed.  Constitutional:      Appearance: He is well-developed.  HENT:     Head: Normocephalic and atraumatic.     Right Ear: External ear normal.     Left Ear: External ear normal.     Nose: Nose normal.  Eyes:     General:        Right eye: No discharge.        Left eye: No discharge.  Pulmonary:     Effort: Pulmonary effort is normal.  Abdominal:     General: There is no distension.     Palpations: Abdomen is soft.     Tenderness: There is no abdominal tenderness.  Genitourinary:    Penis: Circumcised. Lesions present. No erythema, tenderness, discharge or swelling.      Testes:        Right: Tenderness not present.        Left: Tenderness not present.    Musculoskeletal:     Cervical back: Neck supple.  Lymphadenopathy:     Lower Body: No right inguinal adenopathy. No left inguinal adenopathy.  Skin:    General: Skin is warm and dry.  Neurological:     Mental Status: He is alert.  Psychiatric:        Mood and Affect: Mood is not anxious.     ED Results / Procedures / Treatments   Labs (all labs ordered are listed, but only abnormal results are displayed) Labs Reviewed  RPR  HIV ANTIBODY (ROUTINE TESTING W REFLEX)  GC/CHLAMYDIA  PROBE AMP ([place]) NOT AT [facility]    EKG None  Radiology No results found.  Procedures Procedures (including critical care time)  Medications Ordered in ED Medications - No data to display  ED Course  I have reviewed the triage vital signs and the nursing notes.  Pertinent labs & imaging results that were available during my care of the patient were reviewed by me and considered in my medical decision making (see chart for details).    MDM Rules/Calculators/A&P                          Unclear exact cause of these stable lesions for 1 month.  Do not appear like herpes.  Not really consistent with warts though this is a concern given stable appearance.  Will check RPR, HIV, GC/chlamydia.  Advised to hold off on intercourse I will have him follow-up with infectious disease. Final Clinical Impression(s) / ED Diagnoses Final diagnoses:  Penile lesion    Rx / DC Orders ED Discharge Orders    None       Pricilla Loveless, MD 02/13/20 1350

## 2020-02-13 NOTE — Discharge Instructions (Signed)
Do not have sex until cleared by your STD tests and after you have followed up.

## 2020-02-13 NOTE — ED Triage Notes (Signed)
Pt. Stated, I have a rash on my penis for about a onth. I need it check for STD

## 2020-02-14 LAB — RPR: RPR Ser Ql: NONREACTIVE

## 2020-02-16 LAB — GC/CHLAMYDIA PROBE AMP (~~LOC~~) NOT AT ARMC
Chlamydia: NEGATIVE
Comment: NEGATIVE
Comment: NORMAL
Neisseria Gonorrhea: NEGATIVE

## 2020-02-18 LAB — HIV-1/2 AB - DIFFERENTIATION
HIV 1 Ab: NEGATIVE
HIV 2 Ab: NEGATIVE
Note: NEGATIVE

## 2020-02-18 LAB — RNA QUALITATIVE: HIV 1 RNA Qualitative: 1

## 2020-07-19 ENCOUNTER — Emergency Department (HOSPITAL_COMMUNITY)
Admission: EM | Admit: 2020-07-19 | Discharge: 2020-07-19 | Disposition: A | Discharge status: Home / Self Care | Payer: Self-pay | Attending: Emergency Medicine | Admitting: Emergency Medicine

## 2020-07-19 ENCOUNTER — Other Ambulatory Visit: Payer: Self-pay

## 2020-07-19 ENCOUNTER — Encounter (HOSPITAL_COMMUNITY): Payer: Self-pay | Admitting: Emergency Medicine

## 2020-07-19 DIAGNOSIS — Z202 Contact with and (suspected) exposure to infections with a predominantly sexual mode of transmission: Secondary | ICD-10-CM | POA: Insufficient documentation

## 2020-07-19 DIAGNOSIS — Z5321 Procedure and treatment not carried out due to patient leaving prior to being seen by health care provider: Secondary | ICD-10-CM | POA: Insufficient documentation

## 2020-07-19 NOTE — ED Notes (Signed)
Pt states that he is leaving and will come back another day

## 2020-07-19 NOTE — ED Triage Notes (Signed)
Pt. Stated, I have a partner who tested positive for Herpes. I need to be checked

## 2020-07-24 ENCOUNTER — Encounter (HOSPITAL_COMMUNITY): Payer: Self-pay

## 2020-07-24 ENCOUNTER — Other Ambulatory Visit: Payer: Self-pay

## 2020-07-24 ENCOUNTER — Emergency Department (HOSPITAL_COMMUNITY)
Admission: EM | Admit: 2020-07-24 | Discharge: 2020-07-24 | Disposition: A | Discharge status: Home / Self Care | Payer: Self-pay | Attending: Emergency Medicine | Admitting: Emergency Medicine

## 2020-07-24 DIAGNOSIS — Z202 Contact with and (suspected) exposure to infections with a predominantly sexual mode of transmission: Secondary | ICD-10-CM | POA: Insufficient documentation

## 2020-07-24 DIAGNOSIS — F17290 Nicotine dependence, other tobacco product, uncomplicated: Secondary | ICD-10-CM | POA: Insufficient documentation

## 2020-07-24 NOTE — Discharge Instructions (Addendum)
Do not have sex for 2 weeks Have all partners tested and treated If your test is abnormal, you will be called in treatment for Gonorrhea and Chlamydia today. You can also review your results on MyChart Practice safe sex and use a condom to prevent infection or unwanted pregnancy Follow up with the Health Department

## 2020-07-24 NOTE — ED Triage Notes (Signed)
Pt reports exposure to a pt who tested positive for herpes.

## 2020-07-24 NOTE — ED Provider Notes (Signed)
[facility]-EMERGENCY DEPT Provider Note   CSN: [id number] Arrival date & time: 07/24/20  0550     History Chief Complaint  Patient presents with  . Exposure to STD    Steve Wilson is a 24 y.o. male.  HPI 24 year old male with no sniffing medical history presents to the ER with request for STD testing.  States he had a unprotected sexual encounter with someone who he recently found out positive for herpes.  He denies any penile discharge, no dysuria.  He also like to be tested for gonorrhea chlamydia and all other STDs.  Denies any fevers or chills.  Denies any difficulty urinating.  No penile or testicular pain.  Denies any lesions or skin tags on penis.    Past Medical History:  Diagnosis Date  . Heart murmur    requires pre-med abx tx for dental tx, etc  . Meniscus tear    left knee    There are no problems to display for this patient.   Past Surgical History:  Procedure Laterality Date  . KNEE SURGERY    . MENISECTOMY  06/28/2011   Procedure: MENISECTOMY;  Surgeon: Loreta Ave, MD;  Location: [facility];  Service: Orthopedics;;  lateral menisectomy       Family History  Problem Relation Age of Onset  . Hypertension Mother   . Diabetes Maternal Grandmother   . Diabetes Paternal Grandmother     Social History   Tobacco Use  . Smoking status: Current Some Day Smoker    Types: Cigars  . Smokeless tobacco: Never Used  . Tobacco comment: smokers smoke inside  Vaping Use  . Vaping Use: Never used  Substance Use Topics  . Alcohol use: Yes    Comment: socially  . Drug use: Yes    Types: Marijuana    Home Medications Prior to Admission medications   Medication Sig Start Date End Date Taking? Authorizing Provider  acetaminophen (TYLENOL) 325 MG tablet Take 2 tablets (650 mg total) by mouth every 6 (six) hours as needed. Do not take more than 4000mg  of tylenol per day 12/11/17   Couture, Cortni S, PA-C   benzonatate (TESSALON) 100 MG capsule Take 1 capsule (100 mg total) by mouth every 8 (eight) hours. 08/25/19   10/23/19, MD  ranitidine (ZANTAC) 150 MG capsule Take 1 capsule (150 mg total) by mouth 2 (two) times daily for 5 days. 04/13/18 08/25/19  10/23/19, MD  sucralfate (CARAFATE) 1 g tablet Take 1 tablet (1 g total) by mouth 4 (four) times daily -  with meals and at bedtime for 5 days. 04/13/18 08/25/19  10/23/19, MD    Allergies    Nickel  Review of Systems   Review of Systems  Genitourinary: Negative for decreased urine volume, difficulty urinating, dysuria, flank pain, hematuria, penile discharge, penile pain, penile swelling, scrotal swelling, testicular pain and urgency.    Physical Exam Updated Vital Signs BP 116/71 (BP Location: Right Arm)   Pulse 67   Temp 98.4 F (36.9 C) (Oral)   Resp 18   Ht 6\' 7"  (2.007 m)   Wt 99.8 kg   SpO2 96%   BMI 24.78 kg/m   Physical Exam Vitals reviewed.  Constitutional:      Appearance: Normal appearance.  HENT:     Head: Normocephalic and atraumatic.  Eyes:     General:        Right eye: No discharge.  Left eye: No discharge.     Extraocular Movements: Extraocular movements intact.     Conjunctiva/sclera: Conjunctivae normal.  Abdominal:     General: Abdomen is flat.     Tenderness: There is no abdominal tenderness. There is no right CVA tenderness or left CVA tenderness.  Genitourinary:    Penis: Normal.      Testes: Normal.     Comments: Chaperone present Musculoskeletal:        General: No swelling or signs of injury. Normal range of motion.     Right lower leg: No edema.     Left lower leg: No edema.  Neurological:     General: No focal deficit present.     Mental Status: He is alert and oriented to person, place, and time.  Psychiatric:        Mood and Affect: Mood normal.        Behavior: Behavior normal.     ED Results / Procedures / Treatments   Labs (all labs ordered are listed, but  only abnormal results are displayed) Labs Reviewed  HERPES SIMPLEX VIRUS(HSV) DNA BY PCR  HIV ANTIBODY (ROUTINE TESTING W REFLEX)  RPR  GC/CHLAMYDIA PROBE AMP ([place]) NOT AT [facility]    EKG None  Radiology No results found.  Procedures Procedures (including critical care time)  Medications Ordered in ED Medications - No data to display  ED Course  I have reviewed the triage vital signs and the nursing notes.  Pertinent labs & imaging results that were available during my care of the patient were reviewed by me and considered in my medical decision making (see chart for details).    MDM Rules/Calculators/A&P                          Patient is afebrile without abdominal tenderness, abdominal pain or painful bowel movements to indicate prostatitis.  No tenderness to palpation of the testes or epididymis to suggest orchitis or epididymitis.  STD cultures obtained including HIV, HSV, syphilis, gonorrhea and chlamydia. Patient to be discharged with instructions to follow up with health department. Discussed importance of using protection when sexually active. Pt understands that they have GC/Chlamydia cultures pending and that they will need to inform all sexual partners if results return positive. Patient defers treatment for gonorrhea and chlamydia today, would like to wait for cultures to return. I think this is reasonable. PT was informed to follow up on MyChart. Referral to health department provided. He voiced understanding and is agreeable     Final Clinical Impression(s) / ED Diagnoses Final diagnoses:  STD exposure    Rx / DC Orders ED Discharge Orders    None       Leone Brand 07/24/20 2376    Glynn Octave, MD 07/24/20 Flossie Buffy

## 2020-07-25 LAB — RPR: RPR Ser Ql: NONREACTIVE

## 2020-07-25 LAB — HIV ANTIBODY (ROUTINE TESTING W REFLEX): HIV Screen 4th Generation wRfx: REACTIVE — AB

## 2020-07-27 LAB — HSV DNA BY PCR (REFERENCE LAB)
HSV 1 DNA: NEGATIVE
HSV 2 DNA: NEGATIVE

## 2020-07-27 LAB — HIV-1/2 AB - DIFFERENTIATION
HIV 1 Ab: NEGATIVE
HIV 2 Ab: NEGATIVE
Note: NEGATIVE

## 2020-07-27 LAB — RNA QUALITATIVE: HIV 1 RNA Qualitative: 1

## 2021-02-05 ENCOUNTER — Telehealth: Payer: Medicaid Other | Admitting: Family

## 2021-02-05 DIAGNOSIS — M5442 Lumbago with sciatica, left side: Secondary | ICD-10-CM

## 2021-02-06 MED ORDER — BACLOFEN 10 MG PO TABS: 10.0000 mg | ORAL_TABLET | Freq: Three times a day (TID) | ORAL | 0 refills | Status: AC

## 2021-02-06 MED ORDER — NAPROXEN 500 MG PO TABS: 500.0000 mg | ORAL_TABLET | Freq: Two times a day (BID) | ORAL | 0 refills | Status: AC

## 2021-02-06 NOTE — Progress Notes (Signed)

## 2021-02-08 ENCOUNTER — Other Ambulatory Visit: Payer: Self-pay

## 2021-02-08 ENCOUNTER — Emergency Department (HOSPITAL_COMMUNITY)
Admission: EM | Admit: 2021-02-08 | Discharge: 2021-02-08 | Disposition: A | Discharge status: Home / Self Care | Payer: Self-pay | Attending: Emergency Medicine | Admitting: Emergency Medicine

## 2021-02-08 ENCOUNTER — Encounter (HOSPITAL_COMMUNITY): Payer: Self-pay

## 2021-02-08 ENCOUNTER — Emergency Department (HOSPITAL_COMMUNITY): Payer: Self-pay

## 2021-02-08 DIAGNOSIS — R110 Nausea: Secondary | ICD-10-CM | POA: Insufficient documentation

## 2021-02-08 DIAGNOSIS — F17290 Nicotine dependence, other tobacco product, uncomplicated: Secondary | ICD-10-CM | POA: Insufficient documentation

## 2021-02-08 DIAGNOSIS — R1032 Left lower quadrant pain: Secondary | ICD-10-CM | POA: Insufficient documentation

## 2021-02-08 DIAGNOSIS — R001 Bradycardia, unspecified: Secondary | ICD-10-CM | POA: Insufficient documentation

## 2021-02-08 LAB — URINALYSIS, ROUTINE W REFLEX MICROSCOPIC
Bilirubin Urine: NEGATIVE
Glucose, UA: NEGATIVE mg/dL
Hgb urine dipstick: NEGATIVE
Ketones, ur: NEGATIVE mg/dL
Leukocytes,Ua: NEGATIVE
Nitrite: NEGATIVE
Protein, ur: NEGATIVE mg/dL
Specific Gravity, Urine: >1.046 — ABNORMAL HIGH (ref 1.005–1.030)
pH: 6 (ref 5.0–8.0)

## 2021-02-08 LAB — CBC
HCT: 44.3 % (ref 39.0–52.0)
Hemoglobin: 14.6 g/dL (ref 13.0–17.0)
MCH: 30.7 pg (ref 26.0–34.0)
MCHC: 33 g/dL (ref 30.0–36.0)
MCV: 93.1 fL (ref 80.0–100.0)
Platelets: 191 10*3/uL (ref 150–400)
RBC: 4.76 MIL/uL (ref 4.22–5.81)
RDW: 11.9 % (ref 11.5–15.5)
WBC: 4.1 10*3/uL (ref 4.0–10.5)
nRBC: 0 % (ref 0.0–0.2)

## 2021-02-08 LAB — COMPREHENSIVE METABOLIC PANEL
ALT: 14 U/L (ref 0–44)
AST: 18 U/L (ref 15–41)
Albumin: 4 g/dL (ref 3.5–5.0)
Alkaline Phosphatase: 56 U/L (ref 38–126)
Anion gap: 7 (ref 5–15)
BUN: 14 mg/dL (ref 6–20)
CO2: 30 mmol/L (ref 22–32)
Calcium: 9.3 mg/dL (ref 8.9–10.3)
Chloride: 106 mmol/L (ref 98–111)
Creatinine, Ser: 1.04 mg/dL (ref 0.61–1.24)
GFR, Estimated: >60 mL/min (ref >60)
Glucose, Bld: 97 mg/dL (ref 70–99)
Potassium: 4 mmol/L (ref 3.5–5.1)
Sodium: 143 mmol/L (ref 135–145)
Total Bilirubin: 0.5 mg/dL (ref 0.3–1.2)
Total Protein: 7 g/dL (ref 6.5–8.1)

## 2021-02-08 LAB — LIPASE, BLOOD: Lipase: 31 U/L (ref 11–51)

## 2021-02-08 MED ORDER — MORPHINE SULFATE (PF) 4 MG/ML IV SOLN
4.0000 mg | Freq: Once | INTRAVENOUS | Status: AC
  Administered 2021-02-08: 4 mg via INTRAVENOUS
  Filled 2021-02-08: qty 1

## 2021-02-08 MED ORDER — ONDANSETRON HCL 4 MG/2ML IJ SOLN
4.0000 mg | Freq: Once | INTRAMUSCULAR | Status: AC
  Administered 2021-02-08: 4 mg via INTRAVENOUS
  Filled 2021-02-08: qty 2

## 2021-02-08 MED ORDER — IOHEXOL 300 MG/ML  SOLN
100.0000 mL | Freq: Once | INTRAMUSCULAR | Status: AC | PRN
  Administered 2021-02-08: 100 mL via INTRAVENOUS

## 2021-02-08 MED ORDER — SODIUM CHLORIDE (PF) 0.9 % IJ SOLN
INTRAMUSCULAR | Status: AC
  Filled 2021-02-08: qty 50

## 2021-02-08 NOTE — ED Triage Notes (Signed)
C/o left lower abdominal pain that radiates to left flank and some nausea since Friday afternoon.   Denies all urinary symptoms.   8/10 pain.   A/Ox4 Ambulatory in triage.

## 2021-02-08 NOTE — ED Provider Notes (Signed)
[facility]-EMERGENCY DEPT Provider Note   CSN: [id number] Arrival date & time: 02/08/21  1740     History Chief Complaint  Patient presents with   Abdominal Pain    Steve Wilson is a 25 y.o. male.  HPI     25 yo male presents with concern for LLQ abdominal pain.  Reports it began as he was drinking and playing catch on Sunday.  Pain severe, LLQ, worse with movements. Radiates to left flank. Associated nausea. Pain 8/10.  No vomiting, no diarrhea or constipation. No fever, chills. No urinary symptoms. No testicular pain.   Past Medical History:  Diagnosis Date   Heart murmur    requires pre-med abx tx for dental tx, etc   Meniscus tear    left knee    There are no problems to display for this patient.   Past Surgical History:  Procedure Laterality Date   KNEE SURGERY     MENISECTOMY  06/28/2011   Procedure: MENISECTOMY;  Surgeon: Loreta Ave, MD;  [facility];  Service: Orthopedics;;  lateral menisectomy       Family History  Problem Relation Age of Onset   Hypertension Mother    Diabetes Maternal Grandmother    Diabetes Paternal Grandmother     Social History   Tobacco Use   Smoking status: Some Days    Pack years: 0.00    Types: Cigars   Smokeless tobacco: Never   Tobacco comments:    smokers smoke inside  Vaping Use   Vaping Use: Never used  Substance Use Topics   Alcohol use: Yes    Comment: socially   Drug use: Yes    Types: Marijuana    Home Medications Prior to Admission medications   Medication Sig Start Date End Date Taking? Authorizing Provider  acetaminophen (TYLENOL) 325 MG tablet Take 2 tablets (650 mg total) by mouth every 6 (six) hours as needed. Do not take more than 4000mg  of tylenol per day 12/11/17  Yes Couture, Cortni S, PA-C  baclofen (LIORESAL) 10 MG tablet Take 1 tablet (10 mg total) by mouth 3 (three) times daily. 02/06/21   02/08/21, FNP  naproxen (NAPROSYN)  500 MG tablet Take 1 tablet (500 mg total) by mouth 2 (two) times daily with a meal. 02/06/21   Hawks, 02/08/21, FNP  ranitidine (ZANTAC) 150 MG capsule Take 1 capsule (150 mg total) by mouth 2 (two) times daily for 5 days. 04/13/18 08/25/19  10/23/19, MD  sucralfate (CARAFATE) 1 g tablet Take 1 tablet (1 g total) by mouth 4 (four) times daily -  with meals and at bedtime for 5 days. 04/13/18 08/25/19  10/23/19, MD    Allergies    Nickel  Review of Systems   Review of Systems  Constitutional:  Negative for fever.  HENT:  Negative for sore throat.   Eyes:  Negative for visual disturbance.  Respiratory:  Negative for shortness of breath.   Cardiovascular:  Negative for chest pain.  Gastrointestinal:  Positive for abdominal pain and nausea. Negative for constipation, diarrhea and vomiting.  Genitourinary:  Positive for flank pain. Negative for difficulty urinating, dysuria and testicular pain.  Musculoskeletal:  Positive for back pain. Negative for neck stiffness.  Skin:  Negative for rash.  Neurological:  Negative for syncope and headaches.   Physical Exam Updated Vital Signs BP 114/76   Pulse (!) 47   Temp 98.2 F (36.8 C) (Oral)  Resp 13   SpO2 99%   Physical Exam Vitals and nursing note reviewed.  Constitutional:      General: He is not in acute distress.    Appearance: He is well-developed. He is not diaphoretic.  HENT:     Head: Normocephalic and atraumatic.  Eyes:     Conjunctiva/sclera: Conjunctivae normal.  Cardiovascular:     Rate and Rhythm: Normal rate and regular rhythm.     Heart sounds: Normal heart sounds. No murmur heard.   No friction rub. No gallop.  Pulmonary:     Effort: Pulmonary effort is normal. No respiratory distress.     Breath sounds: Normal breath sounds. No wheezing or rales.  Abdominal:     General: There is no distension.     Palpations: Abdomen is soft.     Tenderness: There is no abdominal tenderness. There is no guarding.   Musculoskeletal:     Cervical back: Normal range of motion.  Skin:    General: Skin is warm and dry.  Neurological:     Mental Status: He is alert and oriented to person, place, and time.    ED Results / Procedures / Treatments   Labs (all labs ordered are listed, but only abnormal results are displayed) Labs Reviewed  URINALYSIS, ROUTINE W REFLEX MICROSCOPIC - Abnormal; Notable for the following components:      Result Value   Color, Urine STRAW (*)    Specific Gravity, Urine >1.046 (*)    All other components within normal limits  LIPASE, BLOOD  COMPREHENSIVE METABOLIC PANEL  CBC  RPR  HIV ANTIBODY (ROUTINE TESTING W REFLEX)  GC/CHLAMYDIA PROBE AMP ([place]) NOT AT [facility]    EKG EKG Interpretation  Date/Time:  Wednesday February 08 2021 12:00:12 EDT Ventricular Rate:  47 PR Interval:    QRS Duration: 117 QT Interval:  418 QTC Calculation: 370 R Axis:   83 Text Interpretation: Sinus bradycardia Nonspecific intraventricular conduction delay Minimal ST depression, inferior leads Borderline ST elevation, lateral leads Similar bradycardia, similar ST elevation normal early repol pattern, nonspecific TW changes III new Confirmed by Alvira Monday ([id number]) on 02/08/2021 12:52:04 PM  Radiology CT ABDOMEN PELVIS W CONTRAST  Result Date: 02/08/2021 CLINICAL DATA:  Acute left-sided abdominal pain. EXAM: CT ABDOMEN AND PELVIS WITH CONTRAST TECHNIQUE: Multidetector CT imaging of the abdomen and pelvis was performed using the standard protocol following bolus administration of intravenous contrast. CONTRAST:  OMNIPAQUE IOHEXOL 300 MG/ML  SOLN COMPARISON:  None. FINDINGS: Lower chest: No acute abnormality. Hepatobiliary: No focal liver abnormality is seen. No gallstones, gallbladder wall thickening, or biliary dilatation. Pancreas: Unremarkable. No pancreatic ductal dilatation or surrounding inflammatory changes. Spleen: Normal in size without focal abnormality. Adrenals/Urinary  Tract: Adrenal glands are unremarkable. Kidneys are normal, without renal calculi, focal lesion, or hydronephrosis. Bladder is unremarkable. Stomach/Bowel: Stomach is within normal limits. Appendix appears normal. No evidence of bowel wall thickening, distention, or inflammatory changes. Vascular/Lymphatic: No significant vascular findings are present. No enlarged abdominal or pelvic lymph nodes. Reproductive: Prostate is unremarkable. Other: No abdominal wall hernia or abnormality. No abdominopelvic ascites. Musculoskeletal: No acute or significant osseous findings. IMPRESSION: No definite abnormality seen in the abdomen or pelvis. Electronically Signed   By: Lupita Raider M.D.   On: 02/08/2021 10:39    Procedures Procedures   Medications Ordered in ED Medications  morphine 4 MG/ML injection 4 mg (4 mg Intravenous Given 02/08/21 0851)  ondansetron (ZOFRAN) injection 4 mg (4 mg Intravenous Given 02/08/21  1287)  iohexol (OMNIPAQUE) 300 MG/ML solution 100 mL (100 mLs Intravenous Contrast Given 02/08/21 1016)    ED Course  I have reviewed the triage vital signs and the nursing notes.  Pertinent labs & imaging results that were available during my care of the patient were reviewed by me and considered in my medical decision making (see chart for details).    MDM Rules/Calculators/A&P                           25 yo male presents with concern for LLQ abdominal pain. DDx includes appendicitis, pancreatitis, cholecystitis, pyelonephritis, nephrolithiasis, diverticulitis, hernia. Denies testicular pain. Labs show no sign of pancreatitis or hepatitis. UA without infection.  CT shows no acute abnormality.  EKG with BER, sinus bradycardia similar to prior.  Requesting STI testing which was sent.  Of note, he has had prior positive HIV antibodies but confirmatory tests HIV1/2 were negative and HIV RNA was negative not consistent with HIV.   Possible muscular strain as etiology of pain.  Recommend  continued supportive care. Patient discharged in stable condition with understanding of reasons to return.    Final Clinical Impression(s) / ED Diagnoses Final diagnoses:  Left lower quadrant abdominal pain    Rx / DC Orders ED Discharge Orders     None        Alvira Monday, MD 02/09/21 [phone]

## 2021-02-08 NOTE — ED Notes (Signed)
Pt ambulatory to restroom without assistance. Pt provided urine sample.

## 2021-02-09 LAB — GC/CHLAMYDIA PROBE AMP (~~LOC~~) NOT AT ARMC
Chlamydia: NEGATIVE
Comment: NEGATIVE
Comment: NORMAL
Neisseria Gonorrhea: NEGATIVE

## 2021-02-09 LAB — RPR: RPR Ser Ql: NONREACTIVE

## 2021-02-09 LAB — HIV ANTIBODY (ROUTINE TESTING W REFLEX): HIV Screen 4th Generation wRfx: REACTIVE — AB

## 2021-02-12 LAB — HIV-1/HIV-2 QUALITATIVE RNA
Final Interpretation: NEGATIVE
HIV-1 RNA, Qualitative: NONREACTIVE
HIV-2 RNA, Qualitative: NONREACTIVE

## 2021-02-12 LAB — HIV-1/2 AB - DIFFERENTIATION
HIV 1 Ab: NONREACTIVE
HIV 2 Ab: NONREACTIVE
Note: NEGATIVE

## 2021-03-23 ENCOUNTER — Emergency Department (HOSPITAL_COMMUNITY)
Admission: EM | Admit: 2021-03-23 | Discharge: 2021-03-23 | Disposition: A | Discharge status: Home / Self Care | Payer: Self-pay | Attending: Student | Admitting: Student

## 2021-03-23 ENCOUNTER — Other Ambulatory Visit: Payer: Self-pay

## 2021-03-23 ENCOUNTER — Encounter (HOSPITAL_COMMUNITY): Payer: Self-pay

## 2021-03-23 ENCOUNTER — Emergency Department (HOSPITAL_COMMUNITY): Payer: Self-pay

## 2021-03-23 DIAGNOSIS — R002 Palpitations: Secondary | ICD-10-CM | POA: Insufficient documentation

## 2021-03-23 DIAGNOSIS — F17290 Nicotine dependence, other tobacco product, uncomplicated: Secondary | ICD-10-CM | POA: Insufficient documentation

## 2021-03-23 DIAGNOSIS — R9431 Abnormal electrocardiogram [ECG] [EKG]: Secondary | ICD-10-CM | POA: Insufficient documentation

## 2021-03-23 DIAGNOSIS — R079 Chest pain, unspecified: Secondary | ICD-10-CM | POA: Insufficient documentation

## 2021-03-23 LAB — BASIC METABOLIC PANEL
Anion gap: 6 (ref 5–15)
BUN: 12 mg/dL (ref 6–20)
CO2: 27 mmol/L (ref 22–32)
Calcium: 8.9 mg/dL (ref 8.9–10.3)
Chloride: 109 mmol/L (ref 98–111)
Creatinine, Ser: 1.03 mg/dL (ref 0.61–1.24)
GFR, Estimated: >60 mL/min (ref >60)
Glucose, Bld: 88 mg/dL (ref 70–99)
Potassium: 3.8 mmol/L (ref 3.5–5.1)
Sodium: 142 mmol/L (ref 135–145)

## 2021-03-23 LAB — CBC
HCT: 41.6 % (ref 39.0–52.0)
Hemoglobin: 13.8 g/dL (ref 13.0–17.0)
MCH: 30.7 pg (ref 26.0–34.0)
MCHC: 33.2 g/dL (ref 30.0–36.0)
MCV: 92.4 fL (ref 80.0–100.0)
Platelets: 228 10*3/uL (ref 150–400)
RBC: 4.5 MIL/uL (ref 4.22–5.81)
RDW: 12.1 % (ref 11.5–15.5)
WBC: 4.2 10*3/uL (ref 4.0–10.5)
nRBC: 0 % (ref 0.0–0.2)

## 2021-03-23 LAB — TROPONIN I (HIGH SENSITIVITY): Troponin I (High Sensitivity): <2 ng/L (ref <18)

## 2021-03-23 NOTE — ED Provider Notes (Addendum)
[facility]-EMERGENCY DEPT Provider Note   CSN: [id number] Arrival date & time: 03/23/21  1050     History Chief Complaint  Patient presents with   Chest Pain    Steve Wilson is a 25 y.o. male with history of a cardiac murmur, left meniscal tear who presents emergency department for evaluation of chest pain and palpitations.  Patient states that last night at work he experienced a 3-minute episode of "fluttering" in his chest and pressure-like chest pain that radiated to his left arm.  He spoke with his mother about his symptoms this morning who encouraged him to come to the emergency department for evaluation.  He currently denies chest pain, shortness of breath, abdominal pain, nausea, vomiting or any other systemic symptoms.  Denies syncope.   Chest Pain Associated symptoms: palpitations   Associated symptoms: no abdominal pain, no back pain, no cough, no fever, no shortness of breath and no vomiting       Past Medical History:  Diagnosis Date   Heart murmur    requires pre-med abx tx for dental tx, etc   Meniscus tear    left knee    There are no problems to display for this patient.   Past Surgical History:  Procedure Laterality Date   KNEE SURGERY     MENISECTOMY  06/28/2011   Procedure: MENISECTOMY;  Surgeon: Loreta Ave, MD;  Location: [facility];  Service: Orthopedics;;  lateral menisectomy       Family History  Problem Relation Age of Onset   Hypertension Mother    Diabetes Maternal Grandmother    Diabetes Paternal Grandmother     Social History   Tobacco Use   Smoking status: Some Days    Types: Cigars   Smokeless tobacco: Never   Tobacco comments:    smokers smoke inside  Vaping Use   Vaping Use: Never used  Substance Use Topics   Alcohol use: Yes    Comment: socially   Drug use: Yes    Types: Marijuana    Home Medications Prior to Admission medications   Medication Sig Start Date End Date  Taking? Authorizing Provider  acetaminophen (TYLENOL) 325 MG tablet Take 2 tablets (650 mg total) by mouth every 6 (six) hours as needed. Do not take more than 4000mg  of tylenol per day 12/11/17   Couture, Cortni S, PA-C  baclofen (LIORESAL) 10 MG tablet Take 1 tablet (10 mg total) by mouth 3 (three) times daily. 02/06/21   02/08/21, FNP  naproxen (NAPROSYN) 500 MG tablet Take 1 tablet (500 mg total) by mouth 2 (two) times daily with a meal. 02/06/21   Hawks, 02/08/21, FNP  ranitidine (ZANTAC) 150 MG capsule Take 1 capsule (150 mg total) by mouth 2 (two) times daily for 5 days. 04/13/18 08/25/19  10/23/19, MD  sucralfate (CARAFATE) 1 g tablet Take 1 tablet (1 g total) by mouth 4 (four) times daily -  with meals and at bedtime for 5 days. 04/13/18 08/25/19  10/23/19, MD    Allergies    Nickel  Review of Systems   Review of Systems  Constitutional:  Negative for chills and fever.  HENT:  Negative for ear pain and sore throat.   Eyes:  Negative for pain and visual disturbance.  Respiratory:  Negative for cough and shortness of breath.   Cardiovascular:  Positive for chest pain and palpitations.  Gastrointestinal:  Negative for abdominal pain and vomiting.  Genitourinary:  Negative for dysuria and hematuria.  Musculoskeletal:  Negative for arthralgias and back pain.  Skin:  Negative for color change and rash.  Neurological:  Negative for seizures and syncope.  All other systems reviewed and are negative.  Physical Exam Updated Vital Signs BP 102/82   Pulse (!) 48   Temp 98 F (36.7 C) (Oral)   Resp 18   Ht 6\' 7"  (2.007 m)   Wt 99 kg   SpO2 100%   BMI 24.59 kg/m   Physical Exam Vitals and nursing note reviewed.  Constitutional:      Appearance: He is well-developed.  HENT:     Head: Normocephalic and atraumatic.  Eyes:     Conjunctiva/sclera: Conjunctivae normal.  Cardiovascular:     Rate and Rhythm: Normal rate and regular rhythm.     Heart sounds: No murmur  heard. Pulmonary:     Effort: Pulmonary effort is normal. No respiratory distress.     Breath sounds: Normal breath sounds.  Abdominal:     Palpations: Abdomen is soft.     Tenderness: There is no abdominal tenderness.  Musculoskeletal:     Cervical back: Neck supple.  Skin:    General: Skin is warm and dry.  Neurological:     Mental Status: He is alert.    ED Results / Procedures / Treatments   Labs (all labs ordered are listed, but only abnormal results are displayed) Labs Reviewed  BASIC METABOLIC PANEL  CBC  TROPONIN I (HIGH SENSITIVITY)  TROPONIN I (HIGH SENSITIVITY)    EKG None  Radiology DG Chest Port 1 View  Result Date: 03/23/2021 CLINICAL DATA:  Chest pain EXAM: PORTABLE CHEST 1 VIEW COMPARISON:  Chest radiograph 10/10/2011 FINDINGS: The right costophrenic angle is partially cut off. The cardiomediastinal silhouette is within normal limits. There is no focal consolidation or pulmonary edema. There is no pleural effusion or pneumothorax. The bones are unremarkable. IMPRESSION: No radiographic evidence of acute cardiopulmonary process. Electronically Signed   By: 10/12/2011 MD   On: 03/23/2021 11:59    Procedures Procedures   Medications Ordered in ED Medications - No data to display  ED Course  I have reviewed the triage vital signs and the nursing notes.  Pertinent labs & imaging results that were available during my care of the patient were reviewed by me and considered in my medical decision making (see chart for details).    MDM Rules/Calculators/A&P                           Patient seen in the emergency department for evaluation of palpitations.  Physical exam is unremarkable.  Laboratory evaluation unremarkable including negative high-sensitivity troponin.  X-ray unremarkable.  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  Cardiology was consulted who agrees with this ECG read and is recommending outpatient follow-up with EP  tomorrow or early next week.  Brugada was explained in detail to the patient and he was given strict return precautions which include persistent chest pain, palpitations, syncope or any other concerning symptoms.  I spoke with both the patient and his mother who both voiced understanding of these return precautions.  Patient then discharged with EP follow-up.  Of note, patient with mild bradycardia at discharge.  I reviewed the cardiac monitor which showed no evidence of heart block and as the patient is a previous college D1 athlete, this is not unreasonable.  He has no symptoms of  symptomatic bradycardia and he was safe for discharge. Final Clinical Impression(s) / ED Diagnoses Final diagnoses:  Palpitations  Abnormal EKG    Rx / DC Orders ED Discharge Orders     None        Reginna Sermeno, MD 03/23/21 1750    Glendora Score, MD 03/23/21 1754

## 2021-03-23 NOTE — Discharge Instructions (Addendum)
You were seen in the emergency department for evaluation of palpitations.  Your EKG was concerning for a pattern called Brugada, which can become a major issue if not appropriately addressed by a cardiologist.  I spoke with the cardiologist on-call today who will help set up an outpatient appointment with an electrophysiology cardiologist.  Please call the number provided below.  It is very important that you return to the emergency department if you experience new or worsening palpitations, persistent chest pain, shortness of breath or passing out.  At this time you are safe for discharge, but it is very important that you follow-up with a cardiologist.

## 2021-03-23 NOTE — ED Triage Notes (Signed)
Pt reports left sided chest pain with radiation to left ar, and left neck since last night with nausea. Patient denies shob, cough, fever, headache

## 2021-03-29 ENCOUNTER — Telehealth: Payer: Medicaid Other | Admitting: Physician Assistant

## 2021-03-29 DIAGNOSIS — K047 Periapical abscess without sinus: Secondary | ICD-10-CM

## 2021-03-29 DIAGNOSIS — S025XXA Fracture of tooth (traumatic), initial encounter for closed fracture: Secondary | ICD-10-CM

## 2021-03-29 MED ORDER — AMOXICILLIN 500 MG PO CAPS: 500.0000 mg | ORAL_CAPSULE | Freq: Two times a day (BID) | ORAL | 0 refills | Status: AC

## 2021-03-29 NOTE — Patient Instructions (Signed)
Steve Wilson, thank you for joining Margaretann Loveless, PA-C for today's virtual visit.  While this provider is not your primary care provider (PCP), if your PCP is located in our provider database this encounter information will be shared with them immediately following your visit.  Consent: (Patient) Steve Wilson provided verbal consent for this virtual visit at the beginning of the encounter.  Current Medications:  Current Outpatient Medications:    amoxicillin (AMOXIL) 500 MG capsule, Take 1 capsule (500 mg total) by mouth 2 (two) times daily for 10 days., Disp: 20 capsule, Rfl: 0   acetaminophen (TYLENOL) 325 MG tablet, Take 2 tablets (650 mg total) by mouth every 6 (six) hours as needed. Do not take more than 4000mg  of tylenol per day, Disp: 30 tablet, Rfl: 0   baclofen (LIORESAL) 10 MG tablet, Take 1 tablet (10 mg total) by mouth 3 (three) times daily., Disp: 30 each, Rfl: 0   naproxen (NAPROSYN) 500 MG tablet, Take 1 tablet (500 mg total) by mouth 2 (two) times daily with a meal., Disp: 30 tablet, Rfl: 0   Medications ordered in this encounter:  Meds ordered this encounter  Medications   amoxicillin (AMOXIL) 500 MG capsule    Sig: Take 1 capsule (500 mg total) by mouth 2 (two) times daily for 10 days.    Dispense:  20 capsule    Refill:  0    Order Specific Question:   Supervising Provider    Answer:   , BRIAN [[id number]]     *If you need refills on other medications prior to your next appointment, please contact your pharmacy*  Follow-Up: Call back or seek an in-person evaluation if the symptoms worsen or if the condition fails to improve as anticipated.  Other Instructions Hyacinth Meeker [phone]  [facility] [phone]  [facility] [phone]  [facility] [phone]    If you have been instructed to have an in-person evaluation today at a local Urgent Care facility, please use the link below. It  will take you to a list of all of our available [facility], including address, phone number and hours of operation. Please do not delay care.  [facility]  If you or a family member do not have a primary care provider, use the link below to schedule a visit and establish care. When you choose a [facility] primary care physician or advanced practice provider, you gain a long-term partner in health. Find a Primary Care Provider  Learn more about [facility]'s in-office and virtual care options: [facility] - Get Care Now  Dental Abscess  A dental abscess is an area of pus in or around a tooth. It comes from an infection. It can cause pain and other symptoms. Treatment will help withsymptoms and prevent the infection from spreading. Follow these instructions at home: Medicines Take over-the-counter and prescription medicines only as told by your dentist. If you were prescribed an antibiotic medicine, take it as told by your dentist. Do not stop taking it even if you start to feel better. If you were prescribed a gel that has numbing medicine in it, use it exactly as told. Do not drive or use heavy machinery (like a [phone]) while taking prescription pain medicine. General instructions  Rinse out your mouth often with salt water. To make salt water, dissolve -1 tsp of salt in 1 cup of warm water. Eat a soft diet while your mouth is healing. Drink  enough fluid to keep your urine pale yellow. Do not apply heat to the outside of your mouth. Do not use any products that contain nicotine or tobacco. These include cigarettes and e-cigarettes. If you need help quitting, ask your doctor. Keep all follow-up visits as told by your dentist. This is important.  Prevent an abscess Brush your teeth every morning and every night. Use fluoride toothpaste. Floss your teeth each day. Get dental cleanings as often as told by your dentist. Think about getting dental sealant  put on teeth that have deep holes (decay). Drink water that has fluoride in it. Most tap water has fluoride. Check the label on bottled water to see if it has fluoride in it. Drink water instead of sugary drinks. Eat healthy meals and snacks. Wear a mouth guard or face shield when you play sports. Contact a doctor if: Your pain is worse, and medicine does not help. Get help right away if: You have a fever or chills. Your symptoms suddenly get worse. You have a very bad headache. You have problems breathing or swallowing. You have trouble opening your mouth. You have swelling in your neck or close to your eye. Summary A dental abscess is an area of pus in or around a tooth. It is caused by an infection. Treatment will help with symptoms and prevent the infection from spreading. Take over-the-counter and prescription medicines only as told by your dentist. To prevent an abscess, take good care of your teeth. Brush your teeth every morning and night. Use floss every day. Get dental cleanings as often as told by your dentist. This information is not intended to replace advice given to you by your health care provider. Make sure you discuss any questions you have with your healthcare provider. Document Revised: 01/08/2020 Document Reviewed: 02/19/2020 Elsevier Patient Education  2022 [employer].

## 2021-03-29 NOTE — Progress Notes (Signed)
Virtual Visit Consent   Steve Wilson, you are scheduled for a virtual visit with a [facility] provider today.     Just as with appointments in the office, your consent must be obtained to participate.  Your consent will be active for this visit and any virtual visit you may have with one of our providers in the next 365 days.     If you have a MyChart account, a copy of this consent can be sent to you electronically.  All virtual visits are billed to your insurance company just like a traditional visit in the office.    As this is a virtual visit, video technology does not allow for your provider to perform a traditional examination.  This may limit your provider's ability to fully assess your condition.  If your provider identifies any concerns that need to be evaluated in person or the need to arrange testing (such as labs, EKG, etc.), we will make arrangements to do so.     Although advances in technology are sophisticated, we cannot ensure that it will always work on either your end or our end.  If the connection with a video visit is poor, the visit may have to be switched to a telephone visit.  With either a video or telephone visit, we are not always able to ensure that we have a secure connection.     I need to obtain your verbal consent now.   Are you willing to proceed with your visit today?    Steve Wilson has provided verbal consent on 03/29/2021 for a virtual visit (video or telephone).   Margaretann Loveless, PA-C   Date: 03/29/2021 4:19 PM   Virtual Visit via Video Note   I, Margaretann Loveless, connected with  Steve Wilson  ([id number], [birth year]) on 03/29/21 at  4:15 PM EDT by a video-enabled telemedicine application and verified that I am speaking with the correct person using two identifiers.  Location: Patient: Virtual Visit Location Patient: Home Provider: Virtual Visit Location Provider: Home Office   I discussed the limitations of  evaluation and management by telemedicine and the availability of in person appointments. The patient expressed understanding and agreed to proceed.    History of Present Illness: Steve Wilson is a 25 y.o. who identifies as a male who was assigned male at birth, and is being seen today for possible tooth abscess.  HPI: Dental Pain  This is a new problem. The current episode started in the past 7 days. The problem occurs constantly. The problem has been gradually worsening. The pain is moderate. Associated symptoms include facial pain and thermal sensitivity. Pertinent negatives include no difficulty swallowing, fever, oral bleeding or sinus pressure. Associated symptoms comments: Swollen right cheek. He has tried acetaminophen and ice for the symptoms. The treatment provided no relief.   Reports he has 2 teeth that have come out in the upper gum line on the right and left sides near the back. He reports the one on the right side broke off and he can feel remnants of the tooth still. He then awoke this morning with pain on the right side of his face and swelling to the right cheek. Denies fevers, chills, nausea, or vomiting. Patient does not have a dentist.  Problems: There are no problems to display for this patient.   Allergies:  Allergies  Allergen Reactions   Nickel Rash   Medications:  Current Outpatient Medications:    amoxicillin (AMOXIL) 500  MG capsule, Take 1 capsule (500 mg total) by mouth 2 (two) times daily for 10 days., Disp: 20 capsule, Rfl: 0   acetaminophen (TYLENOL) 325 MG tablet, Take 2 tablets (650 mg total) by mouth every 6 (six) hours as needed. Do not take more than 4000mg  of tylenol per day, Disp: 30 tablet, Rfl: 0   baclofen (LIORESAL) 10 MG tablet, Take 1 tablet (10 mg total) by mouth 3 (three) times daily., Disp: 30 each, Rfl: 0   naproxen (NAPROSYN) 500 MG tablet, Take 1 tablet (500 mg total) by mouth 2 (two) times daily with a meal., Disp: 30 tablet, Rfl:  0  Observations/Objective: Patient is well-developed, well-nourished in no acute distress.  Resting comfortably at home.  Head is normocephalic, atraumatic.  No labored breathing.  Speech is clear and coherent with logical content.  Patient is alert and oriented at baseline.  Right upper cheek just under the zygomatic arch is swollen compared to left  Assessment and Plan: 1. Dental abscess - amoxicillin (AMOXIL) 500 MG capsule; Take 1 capsule (500 mg total) by mouth 2 (two) times daily for 10 days.  Dispense: 20 capsule; Refill: 0  2. Closed fracture of tooth, initial encounter  - Amoxil provided for infection - Advised to use tylenol and ibuprofen alternating every 3-4 hours - Oragel topically, particularly with food and drink - Avoid hot and cold foods and drink - List of emergency dentist supplied via AVS  Follow Up Instructions: I discussed the assessment and treatment plan with the patient. The patient was provided an opportunity to ask questions and all were answered. The patient agreed with the plan and demonstrated an understanding of the instructions.  A copy of instructions were sent to the patient via MyChart.  The patient was advised to call back or seek an in-person evaluation if the symptoms worsen or if the condition fails to improve as anticipated.  Time:  I spent 11 minutes with the patient via telehealth technology discussing the above problems/concerns.    , PA-C

## 2021-04-18 ENCOUNTER — Ambulatory Visit (INDEPENDENT_AMBULATORY_CARE_PROVIDER_SITE_OTHER): Payer: Self-pay | Admitting: Internal Medicine

## 2021-04-18 ENCOUNTER — Ambulatory Visit (INDEPENDENT_AMBULATORY_CARE_PROVIDER_SITE_OTHER): Payer: Self-pay

## 2021-04-18 ENCOUNTER — Other Ambulatory Visit: Payer: Self-pay

## 2021-04-18 ENCOUNTER — Encounter: Payer: Self-pay | Admitting: *Deleted

## 2021-04-18 DIAGNOSIS — R002 Palpitations: Secondary | ICD-10-CM

## 2021-04-18 NOTE — Patient Instructions (Addendum)
Medication Instructions:  Your physician recommends that you continue on your current medications as directed. Please refer to the Current Medication list given to you today.  Labwork: None ordered.  Testing/Procedures: None ordered.  Follow-Up: Your physician wants you to follow-up based on results of heart monitor.  Any Other Special Instructions Will Be Listed Below (If Applicable).  If you need a refill on your cardiac medications before your next appointment, please call your pharmacy.   Your physician has recommended that you wear a Zio monitor.   This monitor is a medical device that records the heart's electrical activity. Doctors most often use these monitors to diagnose arrhythmias. Arrhythmias are problems with the speed or rhythm of the heartbeat. The monitor is a small device applied to your chest. You can wear one while you do your normal daily activities. While wearing this monitor if you have any symptoms to push the button and record what you felt. Once you have worn this monitor for the period of time provider prescribed (Usually 14 days), you will return the monitor device in the postage paid box. Once it is returned they will download the data collected and provide us with a report which the provider will then review and we will call you with those results. Important tips:  Avoid showering during the first 24 hours of wearing the monitor. Avoid excessive sweating to help maximize wear time. Do not submerge the device, no hot tubs, and no swimming pools. Keep any lotions or oils away from the patch. After 24 hours you may shower with the patch on. Take brief showers with your back facing the shower head.  Do not remove patch once it has been placed because that will interrupt data and decrease adhesive wear time. Push the button when you have any symptoms and write down what you were feeling. Once you have completed wearing your monitor, remove and place into box which  has postage paid and place in your outgoing mailbox.  If for some reason you have misplaced your box then call our office and we can provide another box and/or mail it off for you.      

## 2021-04-18 NOTE — Progress Notes (Signed)
14 Day Zio mailed to pt

## 2021-04-18 NOTE — Progress Notes (Signed)
HPI Steve Wilson is referred today by Dr. 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 He specifically denies syncope to me. 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 He notes that at times he feels like his heart beats irrigularly but has never has any documented arrhythmias. There is no family h/o sudden death. He notes that if he strains really hard like when he is working out, he will get lightheaded.  Allergies  Allergen Reactions   Nickel Rash     Current Outpatient Medications  Medication Sig Dispense Refill   acetaminophen (TYLENOL) 325 MG tablet Take 2 tablets (650 mg total) by mouth every 6 (six) hours as needed. Do not take more than 4000mg  of tylenol per day 30 tablet 0   baclofen (LIORESAL) 10 MG tablet Take 1 tablet (10 mg total) by mouth 3 (three) times daily. 30 each 0   naproxen (NAPROSYN) 500 MG tablet Take 1 tablet (500 mg total) by mouth 2 (two) times daily with a meal. 30 tablet 0   No current facility-administered medications for this visit.     Past Medical History:  Diagnosis Date   Heart murmur    requires pre-med abx tx for dental tx, etc   Meniscus tear    left knee    ROS:   All systems reviewed and negative except as noted in the HPI.   Past Surgical History:  Procedure Laterality Date   KNEE SURGERY     MENISECTOMY  06/28/2011   Procedure: MENISECTOMY;  Surgeon: 06/30/2011, MD;  [facility];  Service: Orthopedics;;  lateral menisectomy     Family History  Problem Relation Age of Onset   Hypertension Mother    Diabetes Maternal Grandmother    Diabetes Paternal Grandmother      Social History   Socioeconomic History   Marital status: Single    Spouse name: Not on file   Number of children: Not on file   Years of education: Not on file   Highest education level: Not on file  Occupational History   Not on file   Tobacco Use   Smoking status: Some Days    Types: Cigars   Smokeless tobacco: Never   Tobacco comments:    smokers smoke inside  Vaping Use   Vaping Use: Never used  Substance and Sexual Activity   Alcohol use: Yes    Comment: socially   Drug use: Yes    Types: Marijuana   Sexual activity: Yes    Birth control/protection: None  Other Topics Concern   Not on file  Social History Narrative   Not on file   Social Determinants of Health   Financial Resource Strain: Not on file  Food Insecurity: Not on file  Transportation Needs: Not on file  Physical Activity: Not on file  Stress: Not on file  Social Connections: Not on file  Intimate Partner Violence: Not on file     BP 110/64   Pulse 64   Ht 6\' 7"  (2.007 m)   Wt 224 lb (101.6 kg)   SpO2 96%   BMI 25.23 kg/m   Physical Exam:  Well appearing 25 yo man, NAD HEENT: Unremarkable Neck:  6 cm JVD, no thyromegally Lymphatics:  No adenopathy Back:  No CVA tenderness Lungs:  Clear with no wheezes HEART:  Regular rate rhythm, no murmurs, no rubs, no  clicks Abd:  soft, positive bowel sounds, no organomegally, no rebound, no guarding Ext:  2 plus pulses, no edema, no cyanosis, no clubbing Skin:  No rashes no nodules Neuro:  CN II through XII intact, motor grossly intact  EKG - reviewed.NSR with type 2 Brugada pattern   Assess/Plan:  Type 2 Brugada pattern - I have reviewed all of his ECG's and note that the type 2 pattern was not initially present. He does not have any high risk features. I note that he does have a h/o THC use and will ask him to stop this when I see him back in the office. Dizziness - he has lightheaded spells when he lifts heavy weights. No other spells except when he is straining. Palpitations - he does note some irregular heart beats. No associated syncope or other symptoms. I will ask him to wear a 14 day zio monitor.  Sharlot Gowda [name],MD

## 2021-04-19 ENCOUNTER — Telehealth: Payer: Self-pay | Admitting: *Deleted

## 2021-04-19 NOTE — Telephone Encounter (Signed)
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 He is scheduled to have three teeth surgically extracted under local anesthetic. Should he complete the heart monitor prior to dental work or can he proceed? Do you recommend avoiding epinephrine?

## 2021-04-19 NOTE — Telephone Encounter (Signed)
   [facility] Pre-operative Risk Assessment    Patient Name: RAJAH TAGLIAFERRO  DOB: 1996-04-01 MRN: [id number]  HEARTCARE STAFF:  - IMPORTANT!!!!!! Under Visit Info/Reason for Call, type in Other and utilize the format Clearance MM/DD/YY or Clearance TBD. Do not use dashes or single digits. - Please review there is not already an duplicate clearance open for this procedure. - If request is for dental extraction, please clarify the # of teeth to be extracted. - If the patient is currently at the dentist's office, call Pre-Op Callback Staff (MA/nurse) to input urgent request.  - If the patient is not currently in the dentist office, please route to the Pre-Op pool.  Request for surgical clearance:  What type of surgery is being performed? 3 TEETH TO BE SURGICALLY EXTRACTED PER CONFIRMATION WITH [facility] OFFICE TODAY  When is this surgery scheduled? 04/26/21  What type of clearance is required (medical clearance vs. Pharmacy clearance to hold med vs. Both)? MEDICAL  Are there any medications that need to be held prior to surgery and how long?  NONE LISTED  Practice name and name of physician performing surgery? [facility]; DR. Garnette Scheuermann  What is the office phone number? [phone]   7.   What is the office fax number? [phone]  8.   Anesthesia type (None, local, MAC, general) ? LOCAL   Julaine Hua 04/19/2021, 2:08 PM  _________________________________________________________________   (provider comments below)

## 2021-04-24 DIAGNOSIS — R002 Palpitations: Secondary | ICD-10-CM

## 2021-04-24 NOTE — Telephone Encounter (Signed)
Lidocaine is ok. Avoid epinephrine. He can wear the heart monitor after his extractions. GT

## 2021-04-25 NOTE — Telephone Encounter (Signed)
   Name: Steve Wilson  DOB: 11/18/1995  MRN: [id number]   Primary Cardiologist: Lewayne Bunting, MD  Chart reviewed as part of pre-operative protocol coverage.  Steve Wilson was last seen on 04/18/21 by Dr. Ladona Ridgel.   He does not have a  history of ischemic heart disease. 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 He is schedule to complete a heart monitor to determine next steps in treatment.   Per Dr. Ladona Ridgel Lidocaine is ok. Avoid epinephrine. He can wear the heart monitor after his extractions.   He does not require SBE PPX from a cardiology standpoint.   I will route this recommendation to the requesting party via Epic fax function and remove from pre-op pool. Please call with questions.  Roe Rutherford Josie Burleigh, PA 04/25/2021, 8:18 AM

## 2021-05-24 ENCOUNTER — Telehealth: Payer: Self-pay

## 2021-05-24 DIAGNOSIS — R002 Palpitations: Secondary | ICD-10-CM

## 2021-05-24 NOTE — Telephone Encounter (Signed)
Echo scheduled for 10/26 at 1:05 pm.  Pt made aware of appt date and time by Echo Scheduler.

## 2021-05-24 NOTE — Telephone Encounter (Signed)
Per Dr. Ladona Ridgel-  Based on HM results-order echo and follow up in 6-8 weeks.  Message sent via mychart.

## 2021-05-24 NOTE — Telephone Encounter (Signed)
Staff message sent to EP [facility], to call the pt and arrange 6-8 week follow-up appt with Dr. Ladona Ridgel.  Echo scheduled for 10/26.

## 2021-06-01 ENCOUNTER — Ambulatory Visit: Payer: Medicaid Other | Admitting: Cardiology

## 2021-06-07 ENCOUNTER — Encounter: Payer: Self-pay | Admitting: Cardiology

## 2021-06-07 ENCOUNTER — Ambulatory Visit (HOSPITAL_COMMUNITY): Payer: Medicaid Other | Attending: Cardiology

## 2021-06-07 ENCOUNTER — Encounter (HOSPITAL_COMMUNITY): Payer: Self-pay | Admitting: Internal Medicine

## 2021-06-07 NOTE — Progress Notes (Unsigned)
Patient ID: Steve Wilson, male   DOB: 31-Mar-1996, 25 y.o.   MRN: [id number]  Verified appointment "no show" status with Wallace Cullens at 1:16pm.

## 2021-06-27 ENCOUNTER — Ambulatory Visit: Payer: Self-pay | Admitting: Internal Medicine

## 2021-11-16 ENCOUNTER — Telehealth: Payer: Medicaid Other | Admitting: Physician Assistant

## 2021-11-16 ENCOUNTER — Emergency Department (HOSPITAL_COMMUNITY)
Admission: EM | Admit: 2021-11-16 | Discharge: 2021-11-16 | Discharge status: Against Medical Advice | Payer: Medicaid Other

## 2021-11-16 DIAGNOSIS — R109 Unspecified abdominal pain: Secondary | ICD-10-CM

## 2021-11-16 DIAGNOSIS — R6889 Other general symptoms and signs: Secondary | ICD-10-CM

## 2021-11-16 NOTE — ED Notes (Signed)
Per registration pt left after check in  ?

## 2021-11-16 NOTE — Progress Notes (Signed)
Based on what you shared with me, I feel your condition warrants further evaluation and I recommend that you be seen in a face to face visit. ? ?Giving the ongoing and progressing abdominal pain and headache with other severe symptoms, you need to be evaluated in person for a detailed exam, testing, etc. To make sure nothing is missed and to make sure you get the most appropriate treatment.  ? ?NOTE: There will be NO CHARGE for this eVisit ?  ?If you are having a true medical emergency please call 911.   ?  ? For an urgent face to face visit, [facility] has six urgent care centers for your convenience:  ?  ? [facility] at [facility] ?Get Driving Directions ?[phone] ?[id number] ?[place], [place] [address] ?  ? [facility]) ?Get Driving Directions ?[phone] ?[address] ?[place], [place] [address] ? ?[facility]) ?Get Driving Directions ?[phone] ?[address] ?[place],  [place]  [address] ? ?[facility] ?Get Driving Directions ?[phone] ?[address] ?[place], [place] [address] ?  ?[facility] [place] ?Get Driving Directions  ?[phone] ?[address] ?[address] ?[place], [place] [address] ?  ?[facility] ?Get Driving Directions ?[phone] ?[address] ?[place], [place] [address] ? ?Your MyChart E-visit questionnaire answers were reviewed by a board certified advanced clinical practitioner to complete your personal care plan based on your specific symptoms.  Thank you for using e-Visits. ?  ? ?

## 2021-11-18 ENCOUNTER — Telehealth: Payer: Medicaid Other | Admitting: Nurse Practitioner

## 2021-11-18 DIAGNOSIS — J00 Acute nasopharyngitis [common cold]: Secondary | ICD-10-CM

## 2021-11-18 MED ORDER — FLUTICASONE PROPIONATE 50 MCG/ACT NA SUSP: 2.0000 | Freq: Every day | NASAL | 6 refills | Status: AC

## 2021-11-18 MED ORDER — IBUPROFEN 600 MG PO TABS: 600.0000 mg | ORAL_TABLET | Freq: Three times a day (TID) | ORAL | 0 refills | Status: AC | PRN

## 2021-11-18 NOTE — Progress Notes (Signed)
? ?Virtual Visit Consent  ? ?Steve Wilson, you are scheduled for a virtual visit with Mary-Margaret Hassell Done, [place], a [facility] provider, today.   ?  ?Just as with appointments in the office, your consent must be obtained to participate.  Your consent will be active for this visit and any virtual visit you may have with one of our providers in the next 365 days.   ?  ?If you have a MyChart account, a copy of this consent can be sent to you electronically.  All virtual visits are billed to your insurance company just like a traditional visit in the office.   ? ?As this is a virtual visit, video technology does not allow for your provider to perform a traditional examination.  This may limit your provider's ability to fully assess your condition.  If your provider identifies any concerns that need to be evaluated in person or the need to arrange testing (such as labs, EKG, etc.), we will make arrangements to do so.   ?  ?Although advances in technology are sophisticated, we cannot ensure that it will always work on either your end or our end.  If the connection with a video visit is poor, the visit may have to be switched to a telephone visit.  With either a video or telephone visit, we are not always able to ensure that we have a secure connection.    ? ?I need to obtain your verbal consent now.   Are you willing to proceed with your visit today? YES ?  ?Steve Wilson has provided verbal consent on 11/18/2021 for a virtual visit (video or telephone). ?  ?Mary-Margaret Hassell Done, FNP  ? ?Date: 11/18/2021 9:56 AM ? ? ?Virtual Visit via Video Note  ? ?I, Mary-Margaret Hassell Done, connected with Steve Wilson ([id number], Sep 12, 1995) on 11/18/21 at 10:00 AM EDT by a video-enabled telemedicine application and verified that I am speaking with the correct person using two identifiers. ? ?Location: ?Patient: Virtual Visit Location Patient: driving in car ?Provider: Virtual Visit Location Provider: Mobile ?  ?I  discussed the limitations of evaluation and management by telemedicine and the availability of in person appointments. The patient expressed understanding and agreed to proceed.   ? ?History of Present Illness: ?Steve Wilson is a 26 y.o. who identifies as a male who was assigned male at birth, and is being seen today for flu like symptoms. ? ?HPI: Influenza ?This is a new problem. The current episode started in the past 7 days. The problem has been waxing and waning. Associated symptoms include chills, headaches and swollen glands. Pertinent negatives include no fever. Nothing aggravates the symptoms. He has tried acetaminophen and NSAIDs for the symptoms. The treatment provided mild relief.   ?Review of Systems  ?Constitutional:  Positive for chills. Negative for fever.  ?Neurological:  Positive for headaches.  ? ?Problems:  ?Patient Active Problem List  ? Diagnosis Date Noted  ? Palpitations 04/18/2021  ?  ?Allergies:  ?Allergies  ?Allergen Reactions  ? Nickel Rash  ? ?Medications:  ?Current Outpatient Medications:  ?  acetaminophen (TYLENOL) 325 MG tablet, Take 2 tablets (650 mg total) by mouth every 6 (six) hours as needed. Do not take more than 4000mg  of tylenol per day, Disp: 30 tablet, Rfl: 0 ?  baclofen (LIORESAL) 10 MG tablet, Take 1 tablet (10 mg total) by mouth 3 (three) times daily., Disp: 30 each, Rfl: 0 ?  naproxen (NAPROSYN) 500 MG tablet, Take 1 tablet (500 mg  total) by mouth 2 (two) times daily with a meal., Disp: 30 tablet, Rfl: 0 ? ?Observations/Objective: ?Patient is well-developed, well-nourished in no acute distress.  ?Resting comfortably  at home.  ?Head is normocephalic, atraumatic.  ?No labored breathing.  ?Speech is clear and coherent with logical content.  ?Patient is alert and oriented at baseline.  ? ? ?Assessment and Plan: ? ?Steve Wilson in today with chief complaint of flu like symptoms ? ? ?1. Acute nasopharyngitis ?1. Take meds as prescribed ?2. Use a cool mist  humidifier especially during the winter months and when heat has been humid. ?3. Use saline nose sprays frequently ?4. Saline irrigations of the nose can be very helpful if done frequently. ? * 4X daily for 1 week* ? * Use of a nettie pot can be helpful with this. Follow directions with this* ?5. Drink plenty of fluids ?6. Keep thermostat turn down low ?7.For any cough or congestion- OTC cough meds ?8. For fever or aces or pains- take tylenol or ibuprofen appropriate for age and weight. ? * for fevers greater than 101 orally you may alternate ibuprofen and tylenol every  3 hours. ?  ?Meds ordered this encounter  ?Medications  ? fluticasone (FLONASE) 50 MCG/ACT nasal spray  ?  Sig: Place 2 sprays into both nostrils daily.  ?  Dispense:  16 g  ?  Refill:  6  ?  Order Specific Question:   Supervising Provider  ?  Answer:   Noemi Chapel [[id number]]  ? ibuprofen (ADVIL) 600 MG tablet  ?  Sig: Take 1 tablet (600 mg total) by mouth every 8 (eight) hours as needed.  ?  Dispense:  30 tablet  ?  Refill:  0  ?  Order Specific Question:   Supervising Provider  ?  Answer:   Noemi Chapel [[id number]]  ? ? ? ? ?Follow Up Instructions: ?I discussed the assessment and treatment plan with the patient. The patient was provided an opportunity to ask questions and all were answered. The patient agreed with the plan and demonstrated an understanding of the instructions.  A copy of instructions were sent to the patient via MyChart. ? ?The patient was advised to call back or seek an in-person evaluation if the symptoms worsen or if the condition fails to improve as anticipated. ? ?Time:  ?I spent 9 minutes with the patient via telehealth technology discussing the above problems/concerns.   ? ?Mary-Margaret Hassell Done, FNP ? ?

## 2021-11-24 ENCOUNTER — Telehealth: Payer: Medicaid Other | Admitting: Physician Assistant

## 2021-11-24 DIAGNOSIS — H66002 Acute suppurative otitis media without spontaneous rupture of ear drum, left ear: Secondary | ICD-10-CM

## 2021-11-24 MED ORDER — AMOXICILLIN-POT CLAVULANATE 875-125 MG PO TABS: 1.0000 | ORAL_TABLET | Freq: Two times a day (BID) | ORAL | 0 refills | Status: DC

## 2021-11-24 NOTE — Progress Notes (Signed)
?Virtual Visit Consent  ? ?[name], you are scheduled for a virtual visit with a [facility] provider today.   ?  ?Just as with appointments in the office, your consent must be obtained to participate.  Your consent will be active for this visit and any virtual visit you may have with one of our providers in the next 365 days.   ?  ?If you have a MyChart account, a copy of this consent can be sent to you electronically.  All virtual visits are billed to your insurance company just like a traditional visit in the office.   ? ?As this is a virtual visit, video technology does not allow for your provider to perform a traditional examination.  This may limit your provider's ability to fully assess your condition.  If your provider identifies any concerns that need to be evaluated in person or the need to arrange testing (such as labs, EKG, etc.), we will make arrangements to do so.   ?  ?Although advances in technology are sophisticated, we cannot ensure that it will always work on either your end or our end.  If the connection with a video visit is poor, the visit may have to be switched to a telephone visit.  With either a video or telephone visit, we are not always able to ensure that we have a secure connection.    ? ?I need to obtain your verbal consent now.   Are you willing to proceed with your visit today?  ?  ?Steve Wilson has provided verbal consent on 11/24/2021 for a virtual visit (video or telephone). ?  ?Margaretann Loveless, PA-C  ? ?Date: 11/24/2021 12:53 PM ? ? ?Virtual Visit via Video Note  ? ?Steve Wilson, connected with  Steve Wilson  ([id number], [birth year]) on 11/24/21 at 12:45 PM EDT by a video-enabled telemedicine application and verified that I am speaking with the correct person using two identifiers. ? ?Location: ?Patient: Virtual Visit Location Patient: Home ?Provider: Virtual Visit Location Provider: Home Office ?  ?I discussed the limitations of  evaluation and management by telemedicine and the availability of in person appointments. The patient expressed understanding and agreed to proceed.   ? ?History of Present Illness: ?Steve Wilson is a 26 y.o. who identifies as a male who was assigned male at birth, and is being seen today for possible ear infection. ? ?HPI: Otalgia  ?There is pain in the left ear. This is a new problem. The current episode started 1 to 4 weeks ago. The problem occurs constantly. The problem has been gradually worsening. Maximum temperature: subjective fevers. The pain is moderate. Associated symptoms include ear discharge, headaches, hearing loss and rhinorrhea. He has tried acetaminophen and NSAIDs (flonase) for the symptoms. The treatment provided no relief. There is no history of a chronic ear infection, hearing loss or a tympanostomy tube.   ? ? ?Problems:  ?Patient Active Problem List  ? Diagnosis Date Noted  ? Palpitations 04/18/2021  ?  ?Allergies:  ?Allergies  ?Allergen Reactions  ? Nickel Rash  ? ?Medications:  ?Current Outpatient Medications:  ?  amoxicillin-clavulanate (AUGMENTIN) 875-125 MG tablet, Take 1 tablet by mouth 2 (two) times daily., Disp: 20 tablet, Rfl: 0 ?  acetaminophen (TYLENOL) 325 MG tablet, Take 2 tablets (650 mg total) by mouth every 6 (six) hours as needed. Do not take more than 4000mg  of tylenol per day, Disp: 30 tablet, Rfl: 0 ?  baclofen (LIORESAL) 10 MG  tablet, Take 1 tablet (10 mg total) by mouth 3 (three) times daily., Disp: 30 each, Rfl: 0 ?  fluticasone (FLONASE) 50 MCG/ACT nasal spray, Place 2 sprays into both nostrils daily., Disp: 16 g, Rfl: 6 ?  ibuprofen (ADVIL) 600 MG tablet, Take 1 tablet (600 mg total) by mouth every 8 (eight) hours as needed., Disp: 30 tablet, Rfl: 0 ?  naproxen (NAPROSYN) 500 MG tablet, Take 1 tablet (500 mg total) by mouth 2 (two) times daily with a meal., Disp: 30 tablet, Rfl: 0 ? ?Observations/Objective: ?Patient is well-developed, well-nourished in no  acute distress.  ?Resting comfortably at home.  ?Head is normocephalic, atraumatic.  ?No labored breathing.  ?Speech is clear and coherent with logical content.  ?Patient is alert and oriented at baseline.  ? ? ?Assessment and Plan: ?1. Non-recurrent acute suppurative otitis media of left ear without spontaneous rupture of tympanic membrane ?- amoxicillin-clavulanate (AUGMENTIN) 875-125 MG tablet; Take 1 tablet by mouth 2 (two) times daily.  Dispense: 20 tablet; Refill: 0 ? ?- Worsening symptoms that have not responded to OTC medications.  ?- Will give augmentin  ?- Continue allergy medications/flonase. ?- Stay well hydrated and get plenty of rest.  ?- Seek in person evaluation if no symptom improvement or if symptoms worsen. ? ? ?Follow Up Instructions: ?I discussed the assessment and treatment plan with the patient. The patient was provided an opportunity to ask questions and all were answered. The patient agreed with the plan and demonstrated an understanding of the instructions.  A copy of instructions were sent to the patient via MyChart unless otherwise noted below.  ? ? ?The patient was advised to call back or seek an in-person evaluation if the symptoms worsen or if the condition fails to improve as anticipated. ? ?Time:  ?I spent 12 minutes with the patient via telehealth technology discussing the above problems/concerns.   ? ?Margaretann Loveless, PA-C ?

## 2021-11-24 NOTE — Patient Instructions (Signed)
?[name], thank you for joining Margaretann Loveless, PA-C for today's virtual visit.  While this provider is not your primary care provider (PCP), if your PCP is located in our provider database this encounter information will be shared with them immediately following your visit. ? ?Consent: ?(Patient) Steve Wilson provided verbal consent for this virtual visit at the beginning of the encounter. ? ?Current Medications: ? ?Current Outpatient Medications:  ?  amoxicillin-clavulanate (AUGMENTIN) 875-125 MG tablet, Take 1 tablet by mouth 2 (two) times daily., Disp: 20 tablet, Rfl: 0 ?  acetaminophen (TYLENOL) 325 MG tablet, Take 2 tablets (650 mg total) by mouth every 6 (six) hours as needed. Do not take more than 4000mg  of tylenol per day, Disp: 30 tablet, Rfl: 0 ?  baclofen (LIORESAL) 10 MG tablet, Take 1 tablet (10 mg total) by mouth 3 (three) times daily., Disp: 30 each, Rfl: 0 ?  fluticasone (FLONASE) 50 MCG/ACT nasal spray, Place 2 sprays into both nostrils daily., Disp: 16 g, Rfl: 6 ?  ibuprofen (ADVIL) 600 MG tablet, Take 1 tablet (600 mg total) by mouth every 8 (eight) hours as needed., Disp: 30 tablet, Rfl: 0 ?  naproxen (NAPROSYN) 500 MG tablet, Take 1 tablet (500 mg total) by mouth 2 (two) times daily with a meal., Disp: 30 tablet, Rfl: 0  ? ?Medications ordered in this encounter:  ?Meds ordered this encounter  ?Medications  ? amoxicillin-clavulanate (AUGMENTIN) 875-125 MG tablet  ?  Sig: Take 1 tablet by mouth 2 (two) times daily.  ?  Dispense:  20 tablet  ?  Refill:  0  ?  Order Specific Question:   Supervising Provider  ?  Answer:   [[id number]]  ?  ? ?*If you need refills on other medications prior to your next appointment, please contact your pharmacy* ? ?Follow-Up: ?Call back or seek an in-person evaluation if the symptoms worsen or if the condition fails to improve as anticipated. ? ?Other Instructions ?Otitis Media, Adult ? ?Otitis media is a condition in which the  middle ear is red and swollen (inflamed) and full of fluid. The middle ear is the part of the ear that contains bones for hearing as well as air that helps send sounds to the brain. The condition usually goes away on its own. ?What are the causes? ?This condition is caused by a blockage in the eustachian tube. This tube connects the middle ear to the back of the nose. It normally allows air into the middle ear. The blockage is caused by fluid or swelling. Problems that can cause blockage include: ?A cold or infection that affects the nose, mouth, or throat. ?Allergies. ?An irritant, such as tobacco smoke. ?Adenoids that have become large. The adenoids are soft tissue located in the back of the throat, behind the nose and the roof of the mouth. ?Growth or swelling in the upper part of the throat, just behind the nose (nasopharynx). ?Damage to the ear caused by a change in pressure. This is called barotrauma. ?What increases the risk? ?You are more likely to develop this condition if you: ?Smoke or are exposed to tobacco smoke. ?Have an opening in the roof of your mouth (cleft palate). ?Have acid reflux. ?Have problems in your body's defense system (immune system). ?What are the signs or symptoms? ?Symptoms of this condition include: ?Ear pain. ?Fever. ?Problems with hearing. ?Being tired. ?Fluid leaking from the ear. ?Ringing in the ear. ?How is this treated? ?This condition can go away on  its own within 3-5 days. But if the condition is caused by germs (bacteria) and does not go away on its own, or if it keeps coming back, your doctor may: ?Give you antibiotic medicines. ?Give you medicines for pain. ?Follow these instructions at home: ?Take over-the-counter and prescription medicines only as told by your doctor. ?If you were prescribed an antibiotic medicine, take it as told by your doctor. Do not stop taking it even if you start to feel better. ?Keep all follow-up visits. ?Contact a doctor if: ?You have bleeding  from your nose. ?There is a lump on your neck. ?You are not feeling better in 5 days. ?You feel worse instead of better. ?Get help right away if: ?You have pain that is not helped with medicine. ?You have swelling, redness, or pain around your ear. ?You get a stiff neck. ?You cannot move part of your face (paralysis). ?You notice that the bone behind your ear hurts when you touch it. ?You get a very bad headache. ?Summary ?Otitis media means that the middle ear is red, swollen, and full of fluid. ?This condition usually goes away on its own. ?If the problem does not go away, treatment may be needed. You may be given medicines to treat the infection or to treat your pain. ?If you were prescribed an antibiotic medicine, take it as told by your doctor. Do not stop taking it even if you start to feel better. ?Keep all follow-up visits. ?This information is not intended to replace advice given to you by your health care provider. Make sure you discuss any questions you have with your health care provider. ?Document Revised: 11/07/2020 Document Reviewed: 11/07/2020 ?Elsevier Patient Education ? 2023 Elsevier Inc. ? ? ? ?If you have been instructed to have an in-person evaluation today at a local Urgent Care facility, please use the link below. It will take you to a list of all of our available [facility], including address, phone number and hours of operation. Please do not delay care.  ?[facility] ? ?If you or a family member do not have a primary care provider, use the link below to schedule a visit and establish care. When you choose a [facility] primary care physician or advanced practice provider, you gain a long-term partner in health. ?Find a Primary Care Provider ? ?Learn more about [facility]'s in-office and virtual care options: ?[facility] - Get Care Now ?

## 2022-05-10 ENCOUNTER — Encounter: Payer: Self-pay | Admitting: Physician Assistant

## 2022-05-10 ENCOUNTER — Telehealth: Payer: Medicaid Other | Admitting: Family Medicine

## 2022-05-10 ENCOUNTER — Telehealth: Payer: Self-pay | Admitting: Physician Assistant

## 2022-05-10 DIAGNOSIS — J029 Acute pharyngitis, unspecified: Secondary | ICD-10-CM

## 2022-05-10 NOTE — Progress Notes (Signed)
[place]   Needs work note from [place] earlier was not sure if he needed to do a VV appt to get one.

## 2022-05-10 NOTE — Progress Notes (Signed)
I have spent 5 minutes in review of e-visit questionnaire, review and updating patient chart, medical decision making and response to patient.   Jeffie Widdowson Cody Josee Speece, PA-C    

## 2022-05-10 NOTE — Progress Notes (Signed)
E-Visit for Sore Throat   We are sorry that you are not feeling well.  Here is how we plan to help!   Your symptoms indicate a likely viral infection (Pharyngitis).   Pharyngitis is inflammation in the back of the throat which can cause a sore throat, scratchiness and sometimes difficulty swallowing.   Pharyngitis is typically caused by a respiratory virus and will just run its course.  Please keep in mind that your symptoms could last up to 10 days.  For throat pain, we recommend over the counter oral pain relief medications such as acetaminophen or aspirin, or anti-inflammatory medications such as ibuprofen or naproxen sodium.  Topical treatments such as oral throat lozenges or sprays may be used as needed.  Avoid close contact with loved ones, especially the very young and elderly.  Remember to wash your hands thoroughly throughout the day as this is the number one way to prevent the spread of infection and wipe down door knobs and counters with disinfectant.   After careful review of your answers, I would not recommend an antibiotic for your condition.  Antibiotics should not be used to treat conditions that we suspect are caused by viruses like the virus that causes the common cold or flu. However, some people can have Strep with atypical symptoms. You may need formal testing in clinic or office to confirm if your symptoms continue or worsen.   Providers prescribe antibiotics to treat infections caused by bacteria. Antibiotics are very powerful in treating bacterial infections when they are used properly.  To maintain their effectiveness, they should be used only when necessary.  Overuse of antibiotics has resulted in the development of super bugs that are resistant to treatment!     Home Care: Only take medications as instructed by your medical team. Do not drink alcohol while taking these medications. A steam or ultrasonic humidifier can help congestion.  You can place a towel over your head and  breathe in the steam from hot water coming from a faucet. Avoid close contacts especially the very young and the elderly. Cover your mouth when you cough or sneeze. Always remember to wash your hands.   Get Help Right Away If: You develop worsening fever or throat pain. You develop a severe head ache or visual changes. Your symptoms persist after you have completed your treatment plan.   Make sure you Understand these instructions. Will watch your condition. Will get help right away if you are not doing well or get worse.     Thank you for choosing an e-visit.   Your e-visit answers were reviewed by a board certified advanced clinical practitioner to complete your personal care plan. Depending upon the condition, your plan could have included both over the counter or prescription medications.   Please review your pharmacy choice. Make sure the pharmacy is open so you can pick up prescription now. If there is a problem, you may contact your provider through MyChart messaging and have the prescription routed to another pharmacy.  Your safety is important to us. If you have drug allergies check your prescription carefully.    For the next 24 hours you can use MyChart to ask questions about today's visit, request a non-urgent call back, or ask for a work or school excuse. You will get an email in the next two days asking about your experience. I hope that your e-visit has been valuable and will speed your recovery.  

## 2022-09-11 IMAGING — CT CT ABD-PELV W/ CM
2 of 4 series · 16 of 46 positions shown, 18 images · IV contrast (omnipaque)
Comparison: None.

CLINICAL DATA: Acute left-sided abdominal pain.

EXAM:
CT ABDOMEN AND PELVIS WITH CONTRAST
TECHNIQUE: Multidetector CT imaging of the abdomen and pelvis was performed
using the standard protocol following bolus administration of
intravenous contrast.
CONTRAST:  100mL OMNIPAQUE IOHEXOL 300 MG/ML  SOLN

[Series 2: axial st · axial · 0.80mm/px · z∈[+1041,+1471]mm · 13 of 96 slices shown, 15 images]
[im 5/96  soft-tissue]
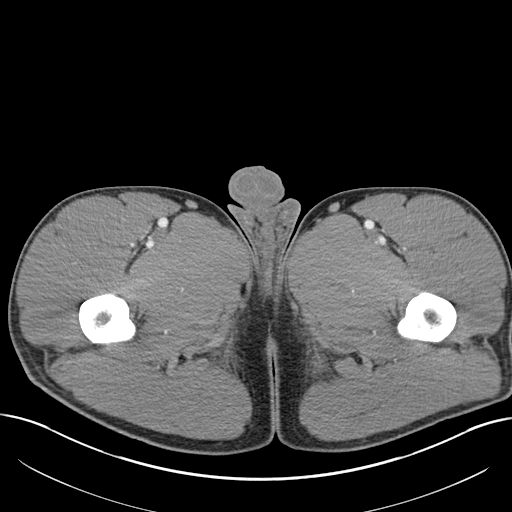
[im 5/96  bone]
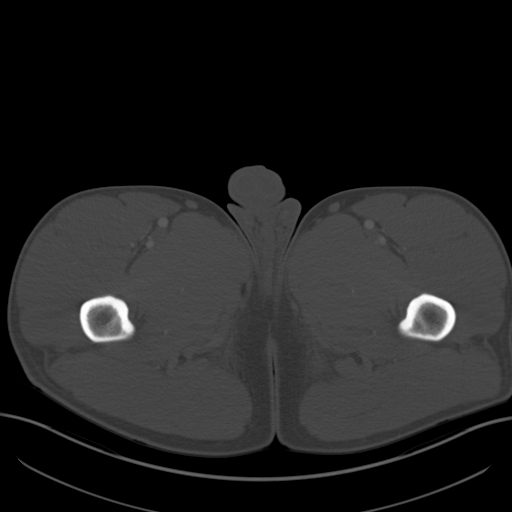
[im 15/96  soft-tissue]
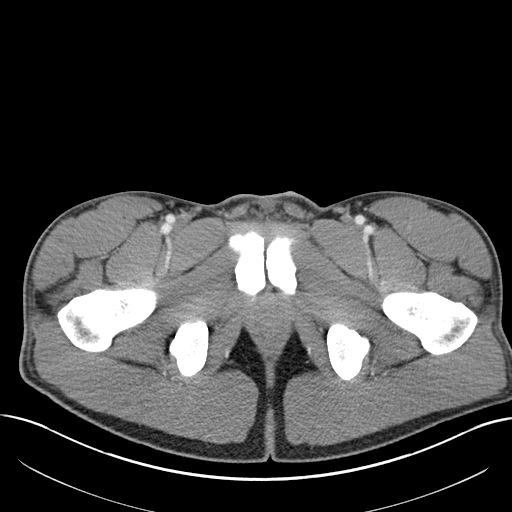
[im 20/96  soft-tissue]
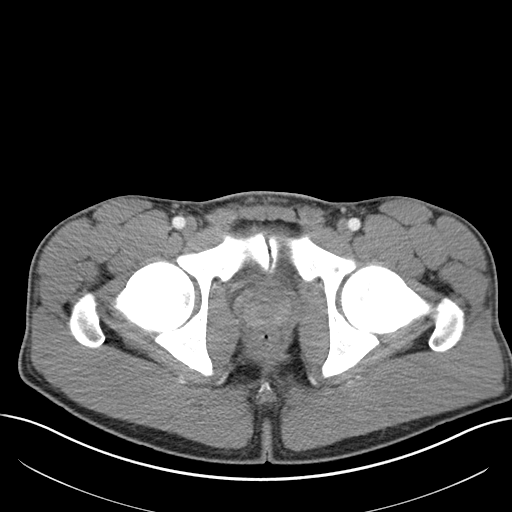
[im 29/96  soft-tissue]
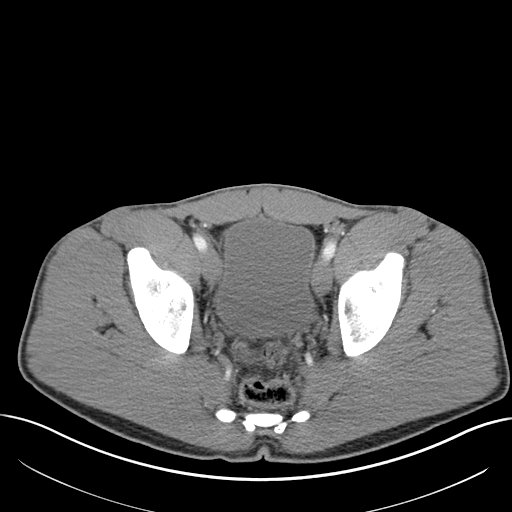
[im 34/96  soft-tissue]
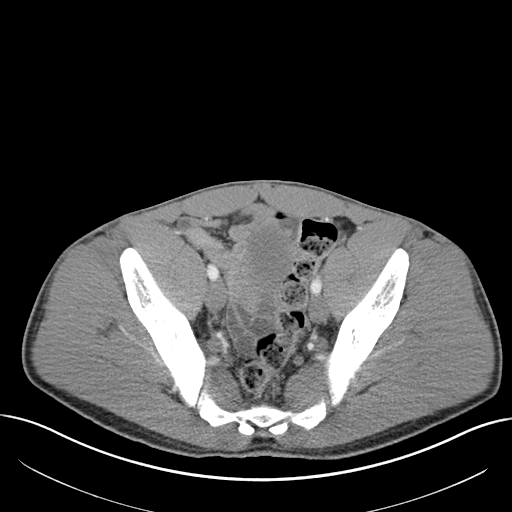
[im 43/96  soft-tissue]
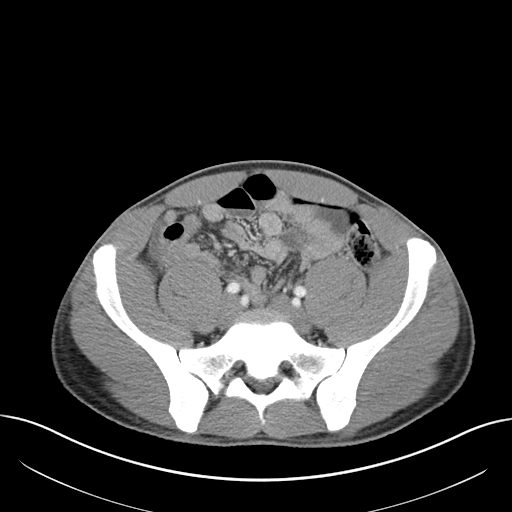
[im 48/96  soft-tissue]
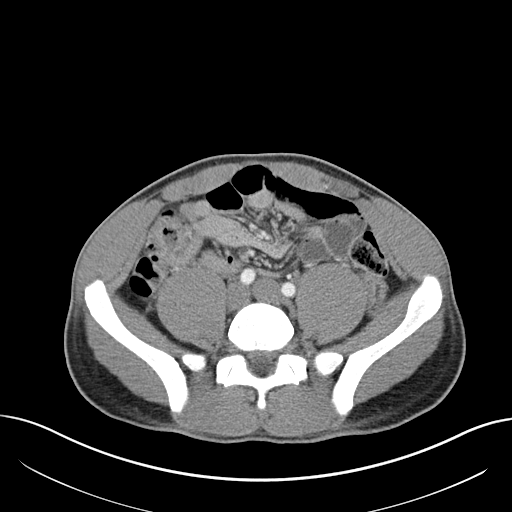
[im 53/96  soft-tissue]
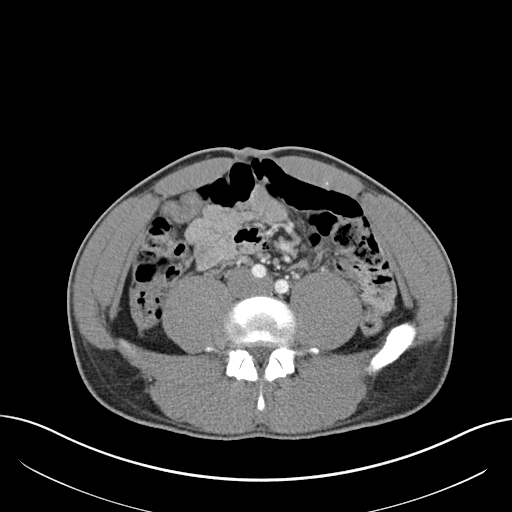
[im 62/96  soft-tissue]
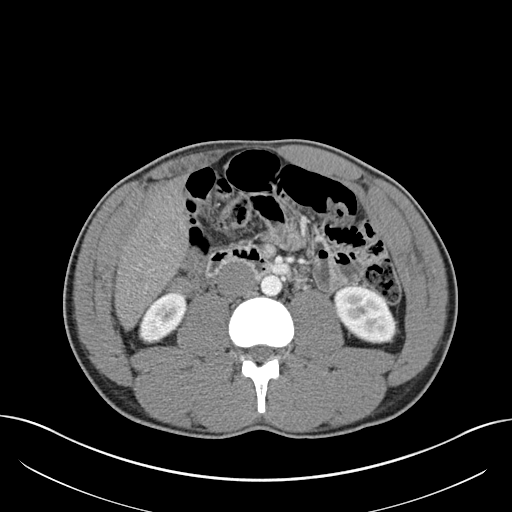
[im 62/96  bone]
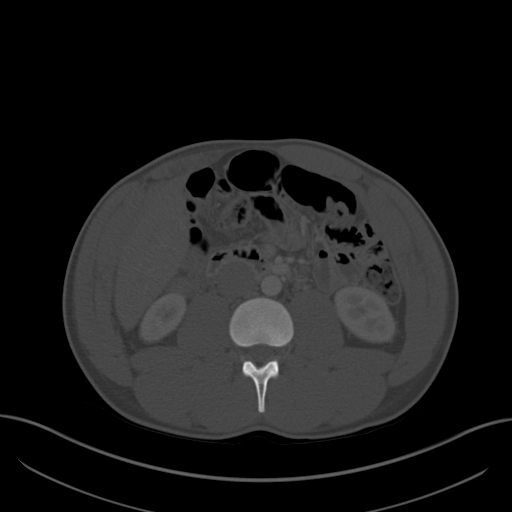
[im 67/96  soft-tissue]
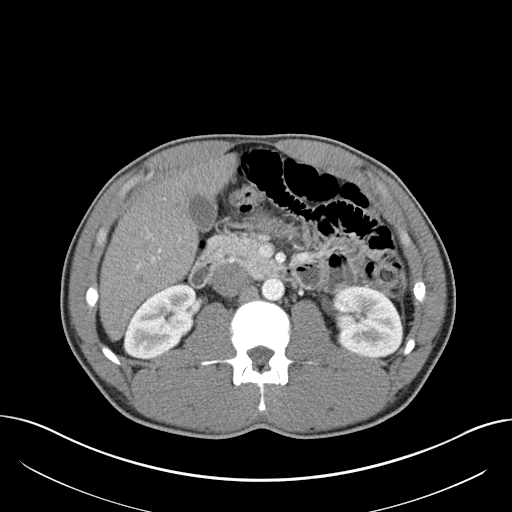
[im 77/96  soft-tissue]
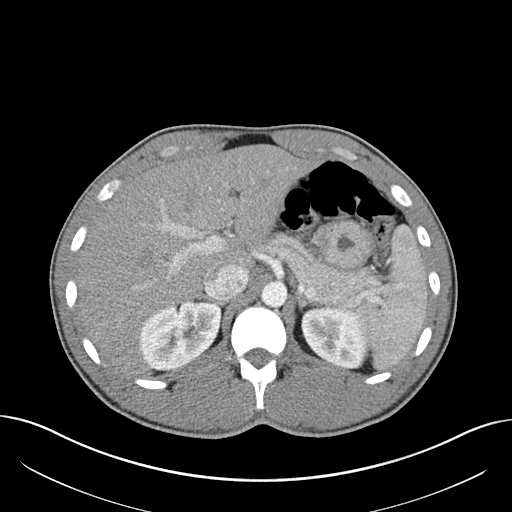
[im 81/96  soft-tissue]
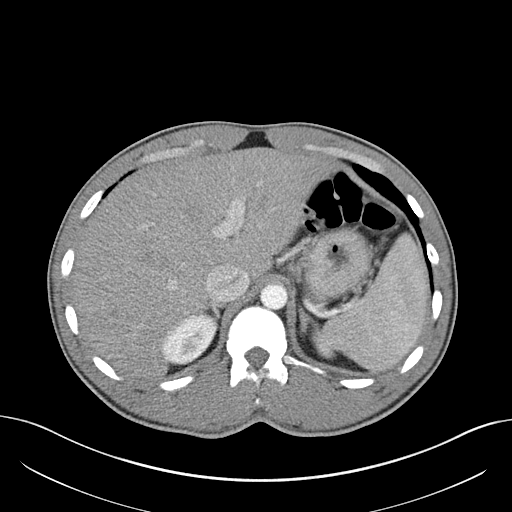
[im 91/96  soft-tissue]
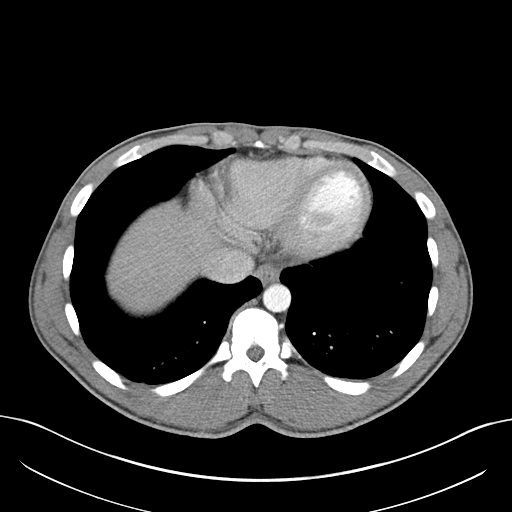

[Series 4: coronal st · coronal · 0.72mm/px · 3 of 141 slices shown]
[im 47/141  soft-tissue]
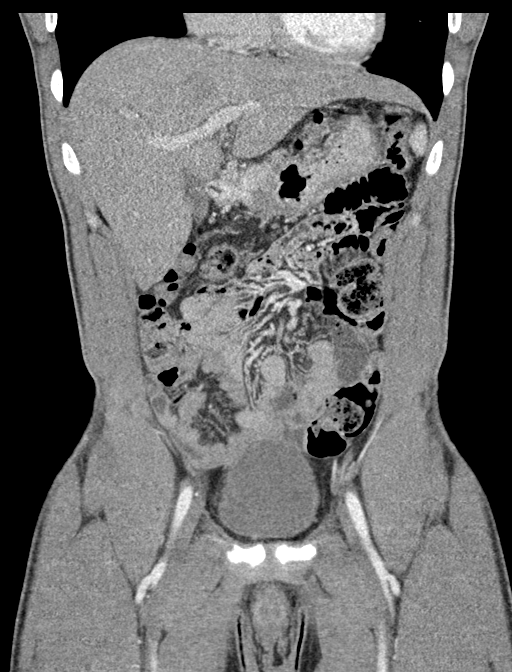
[im 63/141  soft-tissue]
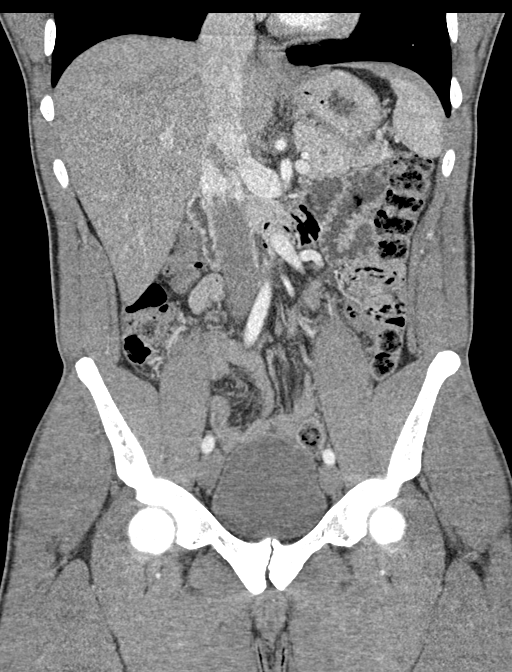
[im 78/141  soft-tissue]
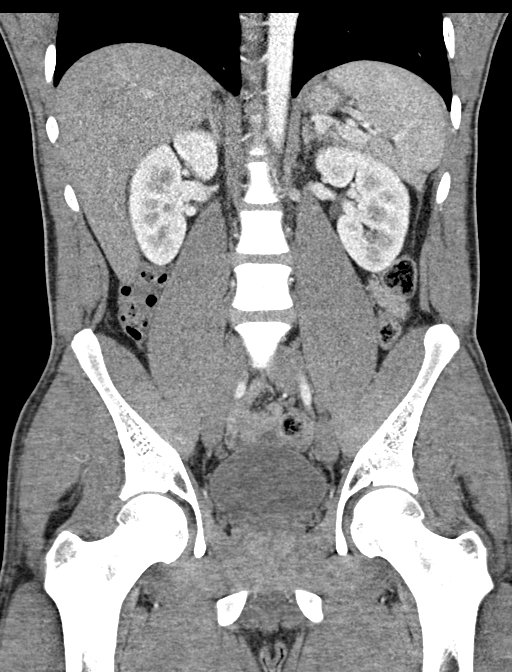

[16 of 46 positions shown; findings below may reference images not displayed]

FINDINGS: Lower chest: No acute abnormality.

Hepatobiliary: No focal liver abnormality is seen. No gallstones,
gallbladder wall thickening, or biliary dilatation.

Pancreas: Unremarkable. No pancreatic ductal dilatation or
surrounding inflammatory changes.

Spleen: Normal in size without focal abnormality.

Adrenals/Urinary Tract: Adrenal glands are unremarkable. Kidneys are
normal, without renal calculi, focal lesion, or hydronephrosis.
Bladder is unremarkable.

Stomach/Bowel: Stomach is within normal limits. Appendix appears
normal. No evidence of bowel wall thickening, distention, or
inflammatory changes.

Vascular/Lymphatic: No significant vascular findings are present. No
enlarged abdominal or pelvic lymph nodes.

Reproductive: Prostate is unremarkable.

Other: No abdominal wall hernia or abnormality. No abdominopelvic
ascites.

Musculoskeletal: No acute or significant osseous findings.
IMPRESSION: No definite abnormality seen in the abdomen or pelvis.

## 2022-11-17 ENCOUNTER — Telehealth: Payer: Self-pay | Admitting: Nurse Practitioner

## 2022-11-17 DIAGNOSIS — J029 Acute pharyngitis, unspecified: Secondary | ICD-10-CM

## 2022-11-17 MED ORDER — AMOXICILLIN 500 MG PO CAPS: 500.0000 mg | ORAL_CAPSULE | Freq: Two times a day (BID) | ORAL | 0 refills | Status: AC

## 2022-11-17 NOTE — Progress Notes (Signed)
Virtual Visit Consent   Steve Wilson, you are scheduled for a virtual visit with a [facility] provider today. Just as with appointments in the office, your consent must be obtained to participate. Your consent will be active for this visit and any virtual visit you may have with one of our providers in the next 365 days. If you have a MyChart account, a copy of this consent can be sent to you electronically.  As this is a virtual visit, video technology does not allow for your provider to perform a traditional examination. This may limit your provider's ability to fully assess your condition. If your provider identifies any concerns that need to be evaluated in person or the need to arrange testing (such as labs, EKG, etc.), we will make arrangements to do so. Although advances in technology are sophisticated, we cannot ensure that it will always work on either your end or our end. If the connection with a video visit is poor, the visit may have to be switched to a telephone visit. With either a video or telephone visit, we are not always able to ensure that we have a secure connection.  By engaging in this virtual visit, you consent to the provision of healthcare and authorize for your insurance to be billed (if applicable) for the services provided during this visit. Depending on your insurance coverage, you may receive a charge related to this service.  I need to obtain your verbal consent now. Are you willing to proceed with your visit today? Steve Wilson has provided verbal consent on 11/17/2022 for a virtual visit (video or telephone). Claiborne Rigg, NP  Date: 11/17/2022 11:20 AM  Virtual Visit via Video Note   I, Claiborne Rigg, connected with  Steve Wilson  ([id number], [birth year]) on 11/17/22 at 10:45 AM EDT by a video-enabled telemedicine application and verified that I am speaking with the correct person using two identifiers.  Location: Patient: Virtual Visit  Location Patient: Home Provider: Virtual Visit Location Provider: Home Office   I discussed the limitations of evaluation and management by telemedicine and the availability of in person appointments. The patient expressed understanding and agreed to proceed.    History of Present Illness: Steve Wilson is a 27 y.o. who identifies as a male who was assigned male at birth, and is being seen today for pharyngitis.  Mr. Broering reports ongoing severe throat pain for 4 days. Associated symptoms include: fever (states he feels feverish), difficulty swallowing, body aches, swollen tonsils and lymphadenopathy.  Tylenol has been ineffective with relieving pain.   Problems:  Patient Active Problem List   Diagnosis Date Noted   Palpitations 04/18/2021    Allergies:  Allergies  Allergen Reactions   Nickel Rash   Medications:  Current Outpatient Medications:    amoxicillin (AMOXIL) 500 MG capsule, Take 1 capsule (500 mg total) by mouth 2 (two) times daily for 10 days., Disp: 20 capsule, Rfl: 0   acetaminophen (TYLENOL) 325 MG tablet, Take 2 tablets (650 mg total) by mouth every 6 (six) hours as needed. Do not take more than 4000mg  of tylenol per day, Disp: 30 tablet, Rfl: 0   baclofen (LIORESAL) 10 MG tablet, Take 1 tablet (10 mg total) by mouth 3 (three) times daily., Disp: 30 each, Rfl: 0   fluticasone (FLONASE) 50 MCG/ACT nasal spray, Place 2 sprays into both nostrils daily., Disp: 16 g, Rfl: 6   ibuprofen (ADVIL) 600 MG tablet, Take 1 tablet (600 mg  total) by mouth every 8 (eight) hours as needed., Disp: 30 tablet, Rfl: 0   naproxen (NAPROSYN) 500 MG tablet, Take 1 tablet (500 mg total) by mouth 2 (two) times daily with a meal., Disp: 30 tablet, Rfl: 0  Observations/Objective: Patient is well-developed, well-nourished in no acute distress.  Resting comfortably at home.  Head is normocephalic, atraumatic.  No labored breathing.  Speech is clear and coherent with logical  content.  Patient is alert and oriented at baseline.    Assessment and Plan: 1. Pharyngitis, unspecified etiology - amoxicillin (AMOXIL) 500 MG capsule; Take 1 capsule (500 mg total) by mouth 2 (two) times daily for 10 days.  Dispense: 20 capsule; Refill: 0 May alternate with liquid motrin and tylenol for pain relief. Needs to be seen in person if symptoms worsen Also recommend swab for STI if sexually active or participating in oral sex Also has history of smoking. May need physical exam if throat pain does not resolve  Follow Up Instructions: I discussed the assessment and treatment plan with the patient. The patient was provided an opportunity to ask questions and all were answered. The patient agreed with the plan and demonstrated an understanding of the instructions.  A copy of instructions were sent to the patient via MyChart unless otherwise noted below.   The patient was advised to call back or seek an in-person evaluation if the symptoms worsen or if the condition fails to improve as anticipated.  Time:  I spent 12 minutes with the patient via telehealth technology discussing the above problems/concerns.    Claiborne Rigg, NP

## 2022-11-17 NOTE — Patient Instructions (Addendum)
  Steve Wilson, thank you for joining Steve Rigg, NP for today's virtual visit.  While this provider is not your primary care provider (PCP), if your PCP is located in our provider database this encounter information will be shared with them immediately following your visit.   A [facility] MyChart account gives you access to today's visit and all your visits, tests, and labs performed at [facility] " click here if you don't have a [facility] MyChart account or go to [email or web address]  Consent: (Patient) Steve Wilson provided verbal consent for this virtual visit at the beginning of the encounter.  Current Medications:  Current Outpatient Medications:    amoxicillin (AMOXIL) 500 MG capsule, Take 1 capsule (500 mg total) by mouth 2 (two) times daily for 10 days., Disp: 20 capsule, Rfl: 0   acetaminophen (TYLENOL) 325 MG tablet, Take 2 tablets (650 mg total) by mouth every 6 (six) hours as needed. Do not take more than 4000mg  of tylenol per day, Disp: 30 tablet, Rfl: 0   baclofen (LIORESAL) 10 MG tablet, Take 1 tablet (10 mg total) by mouth 3 (three) times daily., Disp: 30 each, Rfl: 0   fluticasone (FLONASE) 50 MCG/ACT nasal spray, Place 2 sprays into both nostrils daily., Disp: 16 g, Rfl: 6   ibuprofen (ADVIL) 600 MG tablet, Take 1 tablet (600 mg total) by mouth every 8 (eight) hours as needed., Disp: 30 tablet, Rfl: 0   naproxen (NAPROSYN) 500 MG tablet, Take 1 tablet (500 mg total) by mouth 2 (two) times daily with a meal., Disp: 30 tablet, Rfl: 0   Medications ordered in this encounter:  Meds ordered this encounter  Medications   amoxicillin (AMOXIL) 500 MG capsule    Sig: Take 1 capsule (500 mg total) by mouth 2 (two) times daily for 10 days.    Dispense:  20 capsule    Refill:  0    Order Specific Question:   Supervising Provider    Answer:   Merrilee Jansky [id number]     *If you need refills on other medications prior to your next  appointment, please contact your pharmacy*  Follow-Up: Call back or seek an in-person evaluation if the symptoms worsen or if the condition fails to improve as anticipated.  [facility] [phone]  Other Instructions May alternate with liquid motrin and tylenol for pain relief. Needs to be seen in person if symptoms worsen Also recommend swab for STI if sexually active or participating in oral sex   If you have been instructed to have an in-person evaluation today at a local Urgent Care facility, please use the link below. It will take you to a list of all of our available [facility], including address, phone number and hours of operation. Please do not delay care.  [facility]  If you or a family member do not have a primary care provider, use the link below to schedule a visit and establish care. When you choose a [facility] primary care physician or advanced practice provider, you gain a long-term partner in health. Find a Primary Care Provider  Learn more about [facility]'s in-office and virtual care options: [facility] - Get Care Now

## 2024-01-22 ENCOUNTER — Other Ambulatory Visit: Payer: Self-pay

## 2024-01-22 ENCOUNTER — Emergency Department (HOSPITAL_COMMUNITY)
Admission: EM | Admit: 2024-01-22 | Discharge: 2024-01-22 | Disposition: A | Discharge status: Home / Self Care | Payer: Self-pay | Attending: Emergency Medicine | Admitting: Emergency Medicine

## 2024-01-22 ENCOUNTER — Emergency Department (HOSPITAL_COMMUNITY): Payer: Self-pay

## 2024-01-22 ENCOUNTER — Encounter (HOSPITAL_COMMUNITY): Payer: Self-pay

## 2024-01-22 DIAGNOSIS — M25562 Pain in left knee: Secondary | ICD-10-CM | POA: Insufficient documentation

## 2024-01-22 MED ORDER — ACETAMINOPHEN 500 MG PO TABS
1000.0000 mg | ORAL_TABLET | Freq: Once | ORAL | Status: AC
  Administered 2024-01-22: 1000 mg via ORAL
  Filled 2024-01-22: qty 2

## 2024-01-22 NOTE — ED Notes (Signed)
 Patient transported to X-ray

## 2024-01-22 NOTE — Progress Notes (Signed)
 Pt experiencing knee pain.  Chaplain provided emotional support.  Anton Baton, [place], [facility], Pager [phone]

## 2024-01-22 NOTE — ED Provider Notes (Signed)
 [facility] Provider Note   CSN: [id number] Arrival date & time: 01/22/24  4098     History  Chief Complaint  Patient presents with   Knee Pain    Steve Wilson is a 28 y.o. male with non-contributory PMHx who presents to ED concerned for left knee pain x1 month. Patient stating that he stepped in a hole at work. Patient stating that knee pain feels similar to past meniscus tears and his compression knee brace at home is not helping. Patient experiencing pain when using stairs at home. Patient has not yet reached out to his orthopedic provider - states that [facility] did his meniscus surgery in the past. Patient's last dose of Tylenol  was many days ago and he has not taken any recent OTC medications for his pain. Patient denies any other symptoms today.    Knee Pain      Home Medications Prior to Admission medications   Medication Sig Start Date End Date Taking? Authorizing Provider  acetaminophen  (TYLENOL ) 325 MG tablet Take 2 tablets (650 mg total) by mouth every 6 (six) hours as needed. Do not take more than 4000mg  of tylenol  per day 12/11/17   Couture, Cortni S, PA-C  baclofen  (LIORESAL ) 10 MG tablet Take 1 tablet (10 mg total) by mouth 3 (three) times daily. 02/06/21   Yevette Hem, FNP  fluticasone  (FLONASE ) 50 MCG/ACT nasal spray Place 2 sprays into both nostrils daily. 11/18/21   Gaylyn Keas, Mary-Margaret, FNP  ibuprofen  (ADVIL ) 600 MG tablet Take 1 tablet (600 mg total) by mouth every 8 (eight) hours as needed. 11/18/21   Delfina Feller, FNP  naproxen  (NAPROSYN ) 500 MG tablet Take 1 tablet (500 mg total) by mouth 2 (two) times daily with a meal. 02/06/21   Hawks, Kathaleen Pale A, FNP  ranitidine  (ZANTAC ) 150 MG capsule Take 1 capsule (150 mg total) by mouth 2 (two) times daily for 5 days. 04/13/18 08/25/19  Loman Risk, MD  sucralfate  (CARAFATE ) 1 g tablet Take 1 tablet (1 g total) by mouth 4 (four) times daily -   with meals and at bedtime for 5 days. 04/13/18 08/25/19  Loman Risk, MD      Allergies    Nickel    Review of Systems   Review of Systems  Musculoskeletal:        Knee pain    Physical Exam Updated Vital Signs BP (!) 146/101 (BP Location: Right Arm)   Pulse 73   Temp 98.6 F (37 C) (Oral)   Resp 20   Ht 6' 8 (2.032 m)   Wt 104.3 kg   SpO2 100%   BMI 25.27 kg/m  Physical Exam Vitals and nursing note reviewed.  Constitutional:      General: He is not in acute distress.    Appearance: He is not ill-appearing or toxic-appearing.  HENT:     Head: Normocephalic and atraumatic.  Eyes:     General: No scleral icterus.       Right eye: No discharge.        Left eye: No discharge.     Conjunctiva/sclera: Conjunctivae normal.  Cardiovascular:     Rate and Rhythm: Normal rate.  Pulmonary:     Effort: Pulmonary effort is normal.  Abdominal:     General: Abdomen is flat.  Musculoskeletal:     Comments: Left knee: no obvious swelling, increased warmth or erythema when compared to right knee. +2 pedal pulse. Area non-tense. Sensation to  light touch intact. Passive ROM intact.   Skin:    General: Skin is warm and dry.  Neurological:     General: No focal deficit present.     Mental Status: He is alert and oriented to person, place, and time. Mental status is at baseline.  Psychiatric:        Mood and Affect: Mood normal.        Behavior: Behavior normal.     ED Results / Procedures / Treatments   Labs (all labs ordered are listed, but only abnormal results are displayed) Labs Reviewed - No data to display  EKG None  Radiology DG Knee 2 Views Left Result Date: 01/22/2024 CLINICAL DATA:  Left knee pain. EXAM: LEFT KNEE - 1-2 VIEW COMPARISON:  None Available. FINDINGS: No evidence of acute fracture or dislocation. Suspected small joint effusion. Tricompartmental osteophytosis with joint space narrowing of the lateral femorotibial and patellofemoral compartments. Soft  tissues are unremarkable. IMPRESSION: 1. No acute osseous abnormality.  Suspected small joint effusion. 2. Mild tricompartmental osteoarthritis. Electronically Signed   By: Mannie Seek M.D.   On: 01/22/2024 09:45    Procedures Procedures    Medications Ordered in ED Medications  acetaminophen  (TYLENOL ) tablet 1,000 mg (has no administration in time range)    ED Course/ Medical Decision Making/ A&P                                 Medical Decision Making Amount and/or Complexity of Data Reviewed Radiology: ordered.   This patient presents to the ED for concern of knee pain, this involves an extensive number of treatment options, and is a complaint that carries with it a high risk of complications and morbidity.  The differential diagnosis includes hemarthrosis, gout, septic joint, fracture, tendonitis, carpal tunnel syndrome, muscle strain, bursitis, compartment syndrome   Co morbidities that complicate the patient evaluation  none   Additional history obtained:  No PCP listed in chart   Problem List / ED Course / Critical interventions / Medication management  Patient presents to the ED concerned for left knee pain x 1 month.  No recent OTC pain medicine use.  No other new complaints today.  Physical exam reassuring.  Patient afebrile with stable vitals. I ordered imaging studies including the x-ray. I independently visualized and interpreted imaging. I agree with the radiologist interpretation of arthritis and possible small joint effusion.  Shared results with patient.  Answered all questions.  Offered patient hinged knee brace which he accepted.  Patient declined crutches stating that he already has a pair at home.  Also ordered patient Tylenol  as he has not taken any OTC medicines recently.  Educated patient on alternating Advil  and Tylenol  for pain control.  Recommended following up with orthopedic provider.  Patient verbalized understanding of plan. I have reviewed  the patients home medicines and have made adjustments as needed The patient has been appropriately medically screened and/or stabilized in the ED. I have low suspicion for any other emergent medical condition which would require further screening, evaluation or treatment in the ED or require inpatient management. At time of discharge the patient is hemodynamically stable and in no acute distress. I have discussed work-up results and diagnosis with patient and answered all questions. Patient is agreeable with discharge plan. We discussed strict return precautions for returning to the emergency department and they verbalized understanding.     Ddx these are considered less  likely due to history of present illness and physical exam -hemarthrosis: joint without swelling; ROM intact -gout: no warmth or erythema; ROM intact  -septic joint: afebrile; no warmth or erythema; no skin changes; ROM intact  -fracture: xray without concern  -compartment syndrome: area not tense; neurovascularly intact   Social Determinants of Health:  none         Final Clinical Impression(s) / ED Diagnoses Final diagnoses:  Acute pain of left knee    Rx / DC Orders ED Discharge Orders     None          [place], [place] 01/22/24 1007    Jerilynn Montenegro, MD 01/22/24 1458

## 2024-01-22 NOTE — ED Triage Notes (Signed)
 Pt here for left knee pain that has been going on for almost a month, pt states he stepped in a hole at work. C/O swelling to knee.

## 2024-01-22 NOTE — Discharge Instructions (Addendum)
 Your x-rays showed arthritis.  No other acute abnormalities.  You will need to follow-up with orthopedics for this.  Please also follow-up with a primary care provider.  Seek emergency care if experiencing any new or worsening symptoms.  Alternating between 650 mg Tylenol  and 400 mg Advil : The best way to alternate taking Acetaminophen  (example Tylenol ) and Ibuprofen  (example Advil /Motrin ) is to take them 3 hours apart. For example, if you take ibuprofen  at 6 am you can then take Tylenol  at 9 am. You can continue this regimen throughout the day, making sure you do not exceed the recommended maximum dose for each drug.

## 2024-01-22 NOTE — Progress Notes (Signed)
 Orthopedic Tech Progress Note Patient Details:  Steve Wilson 07-03-1996 [id number]  Ortho Devices Type of Ortho Device: Knee Sleeve Ortho Device/Splint Location: LLE Ortho Device/Splint Interventions: Ordered, Application, Adjustment   Post Interventions Patient Tolerated: Well Instructions Provided: Poper ambulation with device, Care of device, Adjustment of device  [name] 01/22/2024, 10:26 AM
